# Patient Record
Sex: Female | Born: 1955
Health system: Southern US, Community
[De-identification: ages and names within clinical notes are randomized; demographics above are authoritative.]

## PROBLEM LIST (undated history)

## (undated) DIAGNOSIS — R51 Headache: Secondary | ICD-10-CM

## (undated) DIAGNOSIS — H269 Unspecified cataract: Secondary | ICD-10-CM

## (undated) DIAGNOSIS — E119 Type 2 diabetes mellitus without complications: Secondary | ICD-10-CM

## (undated) DIAGNOSIS — E785 Hyperlipidemia, unspecified: Secondary | ICD-10-CM

## (undated) HISTORY — DX: Unspecified cataract: H26.9

## (undated) HISTORY — DX: Hyperlipidemia, unspecified: E78.5

## (undated) HISTORY — PX: BREAST SURGERY: SHX581

## (undated) HISTORY — DX: Headache: R51

## (undated) HISTORY — DX: Morbid (severe) obesity due to excess calories: E66.01

---
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
## 1998-06-06 ENCOUNTER — Other Ambulatory Visit: Admission: RE | Admit: 1998-06-06 | Discharge: 1998-06-06 | Payer: Self-pay | Admitting: Obstetrics & Gynecology

## 1999-07-18 ENCOUNTER — Other Ambulatory Visit: Admission: RE | Admit: 1999-07-18 | Discharge: 1999-07-18 | Payer: Self-pay | Admitting: Obstetrics & Gynecology

## 2000-08-19 ENCOUNTER — Other Ambulatory Visit: Admission: RE | Admit: 2000-08-19 | Discharge: 2000-08-19 | Payer: Self-pay | Admitting: Obstetrics & Gynecology

## 2001-08-26 ENCOUNTER — Other Ambulatory Visit: Admission: RE | Admit: 2001-08-26 | Discharge: 2001-08-26 | Payer: Self-pay | Admitting: Obstetrics & Gynecology

## 2002-10-23 ENCOUNTER — Other Ambulatory Visit: Admission: RE | Admit: 2002-10-23 | Discharge: 2002-10-23 | Payer: Self-pay | Admitting: Obstetrics & Gynecology

## 2003-12-15 ENCOUNTER — Other Ambulatory Visit: Admission: RE | Admit: 2003-12-15 | Discharge: 2003-12-15 | Payer: Self-pay | Admitting: Obstetrics & Gynecology

## 2005-01-02 ENCOUNTER — Other Ambulatory Visit: Admission: RE | Admit: 2005-01-02 | Discharge: 2005-01-02 | Payer: Self-pay | Admitting: Obstetrics & Gynecology

## 2005-02-19 ENCOUNTER — Ambulatory Visit (HOSPITAL_COMMUNITY): Admission: RE | Admit: 2005-02-19 | Discharge: 2005-02-19 | Payer: Self-pay | Admitting: Gastroenterology

## 2010-09-01 ENCOUNTER — Other Ambulatory Visit: Payer: Self-pay | Admitting: Orthopedic Surgery

## 2010-09-01 DIAGNOSIS — Q762 Congenital spondylolisthesis: Secondary | ICD-10-CM

## 2010-09-06 ENCOUNTER — Ambulatory Visit
Admission: RE | Admit: 2010-09-06 | Discharge: 2010-09-06 | Disposition: A | Discharge status: Home / Self Care | Payer: 59 | Source: Ambulatory Visit | Attending: Orthopedic Surgery | Admitting: Orthopedic Surgery

## 2010-09-06 DIAGNOSIS — Q762 Congenital spondylolisthesis: Secondary | ICD-10-CM

## 2010-09-07 ENCOUNTER — Other Ambulatory Visit: Payer: Self-pay | Admitting: Orthopedic Surgery

## 2010-09-07 DIAGNOSIS — M545 Low back pain, unspecified: Secondary | ICD-10-CM

## 2010-09-08 ENCOUNTER — Ambulatory Visit
Admission: RE | Admit: 2010-09-08 | Discharge: 2010-09-08 | Disposition: A | Discharge status: Home / Self Care | Payer: 59 | Source: Ambulatory Visit | Attending: Orthopedic Surgery | Admitting: Orthopedic Surgery

## 2010-09-08 DIAGNOSIS — M545 Low back pain, unspecified: Secondary | ICD-10-CM

## 2010-11-27 ENCOUNTER — Other Ambulatory Visit: Payer: Self-pay | Admitting: Orthopedic Surgery

## 2010-11-27 DIAGNOSIS — M549 Dorsalgia, unspecified: Secondary | ICD-10-CM

## 2010-11-28 ENCOUNTER — Ambulatory Visit
Admission: RE | Admit: 2010-11-28 | Discharge: 2010-11-28 | Disposition: A | Discharge status: Home / Self Care | Payer: 59 | Source: Ambulatory Visit | Attending: Orthopedic Surgery | Admitting: Orthopedic Surgery

## 2010-11-28 VITALS — BP 140/90 | HR 70

## 2010-11-28 DIAGNOSIS — M549 Dorsalgia, unspecified: Secondary | ICD-10-CM

## 2010-11-28 MED ORDER — IOHEXOL 180 MG/ML  SOLN: 1.0000 mL | Freq: Once | INTRAMUSCULAR | Status: AC | PRN

## 2010-11-28 MED ORDER — METHYLPREDNISOLONE ACETATE 40 MG/ML INJ SUSP (RADIOLOG: 120.0000 mg | Freq: Once | INTRAMUSCULAR | Status: DC

## 2011-02-26 ENCOUNTER — Other Ambulatory Visit: Payer: Self-pay | Admitting: Orthopedic Surgery

## 2011-02-26 DIAGNOSIS — M545 Low back pain, unspecified: Secondary | ICD-10-CM

## 2011-03-01 ENCOUNTER — Ambulatory Visit
Admission: RE | Admit: 2011-03-01 | Discharge: 2011-03-01 | Disposition: A | Discharge status: Home / Self Care | Payer: 59 | Source: Ambulatory Visit | Attending: Orthopedic Surgery | Admitting: Orthopedic Surgery

## 2011-03-01 DIAGNOSIS — M545 Low back pain, unspecified: Secondary | ICD-10-CM

## 2011-03-01 MED ORDER — IOHEXOL 180 MG/ML  SOLN
1.0000 mL | Freq: Once | INTRAMUSCULAR | Status: AC | PRN
  Administered 2011-03-01: 1 mL via EPIDURAL

## 2011-03-01 MED ORDER — METHYLPREDNISOLONE ACETATE 40 MG/ML INJ SUSP (RADIOLOG
120.0000 mg | Freq: Once | INTRAMUSCULAR | Status: AC
  Administered 2011-03-01: 120 mg via EPIDURAL

## 2011-06-06 ENCOUNTER — Other Ambulatory Visit: Payer: Self-pay | Admitting: Orthopedic Surgery

## 2011-06-06 DIAGNOSIS — M4310 Spondylolisthesis, site unspecified: Secondary | ICD-10-CM

## 2011-06-12 ENCOUNTER — Ambulatory Visit
Admission: RE | Admit: 2011-06-12 | Discharge: 2011-06-12 | Disposition: A | Discharge status: Home / Self Care | Payer: 59 | Source: Ambulatory Visit | Attending: Orthopedic Surgery | Admitting: Orthopedic Surgery

## 2011-06-12 DIAGNOSIS — M4310 Spondylolisthesis, site unspecified: Secondary | ICD-10-CM

## 2011-06-12 MED ORDER — IOHEXOL 180 MG/ML  SOLN
1.0000 mL | Freq: Once | INTRAMUSCULAR | Status: AC | PRN
  Administered 2011-06-12: 1 mL via EPIDURAL

## 2011-06-12 MED ORDER — METHYLPREDNISOLONE ACETATE 40 MG/ML INJ SUSP (RADIOLOG
120.0000 mg | Freq: Once | INTRAMUSCULAR | Status: AC
  Administered 2011-06-12: 120 mg via EPIDURAL

## 2011-06-12 NOTE — Discharge Instructions (Signed)
 Post Procedure Spinal Discharge Instruction Sheet  1. You may resume a regular diet and any medications that you routinely take (including pain medications).  2. No driving day of procedure.  3. Light activity throughout the rest of the day.  Do not do any strenuous work, exercise, bending or lifting.  The day following the procedure, you can resume normal physical activity but you should refrain from exercising or physical therapy for at least three days thereafter.   Common Side Effects:   Headaches- take your usual medications as directed by your physician.  Increase your fluid intake.  Caffeinated beverages may be helpful.  Lie flat in bed until your headache resolves.   Restlessness or inability to sleep- you may have trouble sleeping for the next few days.  Ask your referring physician if you need any medication for sleep.   Facial flushing or redness- should subside within a few days.   Increased pain- a temporary increase in pain a day or two following your procedure is not unusual.  Take your pain medication as prescribed by your referring physician.   Leg cramps  Please contact our office at [phone] for the following symptoms:  Fever greater than 100 degrees.  Headaches unresolved with medication after 2-3 days.  Increased swelling, pain, or redness at injection site.  Thank you for visiting our office.

## 2011-08-07 ENCOUNTER — Other Ambulatory Visit: Payer: Self-pay | Admitting: Orthopedic Surgery

## 2011-08-07 DIAGNOSIS — M4310 Spondylolisthesis, site unspecified: Secondary | ICD-10-CM

## 2011-08-08 ENCOUNTER — Ambulatory Visit
Admission: RE | Admit: 2011-08-08 | Discharge: 2011-08-08 | Disposition: A | Discharge status: Home / Self Care | Payer: 59 | Source: Ambulatory Visit | Attending: Orthopedic Surgery | Admitting: Orthopedic Surgery

## 2011-08-08 VITALS — BP 146/79 | HR 80

## 2011-08-08 DIAGNOSIS — M4310 Spondylolisthesis, site unspecified: Secondary | ICD-10-CM

## 2011-08-08 MED ORDER — METHYLPREDNISOLONE ACETATE 40 MG/ML INJ SUSP (RADIOLOG
120.0000 mg | Freq: Once | INTRAMUSCULAR | Status: AC
  Administered 2011-08-08: 120 mg via EPIDURAL

## 2011-08-08 MED ORDER — IOHEXOL 180 MG/ML  SOLN
1.0000 mL | Freq: Once | INTRAMUSCULAR | Status: AC | PRN
  Administered 2011-08-08: 1 mL via EPIDURAL

## 2011-09-28 ENCOUNTER — Other Ambulatory Visit: Payer: Self-pay | Admitting: Orthopedic Surgery

## 2011-09-28 DIAGNOSIS — M4310 Spondylolisthesis, site unspecified: Secondary | ICD-10-CM

## 2011-10-01 ENCOUNTER — Ambulatory Visit
Admission: RE | Admit: 2011-10-01 | Discharge: 2011-10-01 | Disposition: A | Discharge status: Home / Self Care | Payer: BC Managed Care – PPO | Source: Ambulatory Visit | Attending: Orthopedic Surgery | Admitting: Orthopedic Surgery

## 2011-10-01 VITALS — BP 136/88 | HR 71 | Ht 64.0 in | Wt 240.0 lb

## 2011-10-01 DIAGNOSIS — M4310 Spondylolisthesis, site unspecified: Secondary | ICD-10-CM

## 2011-10-01 MED ORDER — IOHEXOL 180 MG/ML  SOLN
1.0000 mL | Freq: Once | INTRAMUSCULAR | Status: AC | PRN
  Administered 2011-10-01: 1 mL via EPIDURAL

## 2011-10-01 MED ORDER — METHYLPREDNISOLONE ACETATE 40 MG/ML INJ SUSP (RADIOLOG
120.0000 mg | Freq: Once | INTRAMUSCULAR | Status: AC
  Administered 2011-10-01: 120 mg via EPIDURAL

## 2011-10-18 ENCOUNTER — Ambulatory Visit (INDEPENDENT_AMBULATORY_CARE_PROVIDER_SITE_OTHER): Payer: BC Managed Care – PPO | Admitting: Family Medicine

## 2011-10-18 ENCOUNTER — Encounter: Payer: Self-pay | Admitting: Family Medicine

## 2011-10-18 VITALS — BP 138/82 | HR 77 | Temp 97.9°F | Resp 18 | Ht 63.5 in | Wt 248.0 lb

## 2011-10-18 DIAGNOSIS — Z01419 Encounter for gynecological examination (general) (routine) without abnormal findings: Secondary | ICD-10-CM

## 2011-10-18 DIAGNOSIS — Z0000 Encounter for general adult medical examination without abnormal findings: Secondary | ICD-10-CM

## 2011-10-18 DIAGNOSIS — Z130 Encounter for screening for diseases of the blood and blood-forming organs and certain disorders involving the immune mechanism: Secondary | ICD-10-CM

## 2011-10-18 DIAGNOSIS — Z124 Encounter for screening for malignant neoplasm of cervix: Secondary | ICD-10-CM

## 2011-10-18 DIAGNOSIS — R0789 Other chest pain: Secondary | ICD-10-CM

## 2011-10-18 DIAGNOSIS — B351 Tinea unguium: Secondary | ICD-10-CM

## 2011-10-18 DIAGNOSIS — R519 Headache, unspecified: Secondary | ICD-10-CM

## 2011-10-18 DIAGNOSIS — F4321 Adjustment disorder with depressed mood: Secondary | ICD-10-CM

## 2011-10-18 DIAGNOSIS — R51 Headache: Secondary | ICD-10-CM

## 2011-10-18 DIAGNOSIS — E785 Hyperlipidemia, unspecified: Secondary | ICD-10-CM

## 2011-10-18 LAB — COMPREHENSIVE METABOLIC PANEL WITH GFR
ALT: 18 U/L (ref 0–35)
AST: 16 U/L (ref 0–37)
Albumin: 4.1 g/dL (ref 3.5–5.2)
Alkaline Phosphatase: 67 U/L (ref 39–117)
BUN: 16 mg/dL (ref 6–23)
CO2: 22 meq/L (ref 19–32)
Calcium: 9.3 mg/dL (ref 8.4–10.5)
Chloride: 106 meq/L (ref 96–112)
Creat: 0.53 mg/dL (ref 0.50–1.10)
Glucose, Bld: 96 mg/dL (ref 70–99)
Potassium: 4 meq/L (ref 3.5–5.3)
Sodium: 141 meq/L (ref 135–145)
Total Bilirubin: 0.6 mg/dL (ref 0.3–1.2)
Total Protein: 6.7 g/dL (ref 6.0–8.3)

## 2011-10-18 LAB — POCT URINALYSIS DIPSTICK
Glucose, UA: NEGATIVE
Leukocytes, UA: NEGATIVE
Nitrite, UA: NEGATIVE
Protein, UA: NEGATIVE
Urobilinogen, UA: 0.2

## 2011-10-18 LAB — CBC WITH DIFFERENTIAL/PLATELET
Basophils Absolute: 0 10*3/uL (ref 0.0–0.1)
Eosinophils Relative: 1 % (ref 0–5)
HCT: 40.5 % (ref 36.0–46.0)
Lymphocytes Relative: 27 % (ref 12–46)
Lymphs Abs: 2.7 10*3/uL (ref 0.7–4.0)
MCV: 89.2 fL (ref 78.0–100.0)
Neutro Abs: 6.6 10*3/uL (ref 1.7–7.7)
Platelets: 250 10*3/uL (ref 150–400)
RBC: 4.54 MIL/uL (ref 3.87–5.11)
RDW: 14.5 % (ref 11.5–15.5)
WBC: 9.9 10*3/uL (ref 4.0–10.5)

## 2011-10-18 LAB — IFOBT (OCCULT BLOOD): IFOBT: NEGATIVE

## 2011-10-18 LAB — TSH: TSH: 1.321 u[IU]/mL (ref 0.350–4.500)

## 2011-10-18 LAB — LIPID PANEL
Cholesterol: 256 mg/dL — ABNORMAL HIGH (ref 0–200)
Total CHOL/HDL Ratio: 5.2 Ratio
Triglycerides: 153 mg/dL — ABNORMAL HIGH (ref <150)
VLDL: 31 mg/dL (ref 0–40)

## 2011-10-18 MED ORDER — NAFTIFINE HCL 1 % EX CREA: TOPICAL_CREAM | Freq: Every day | CUTANEOUS | Status: DC

## 2011-10-18 NOTE — Patient Instructions (Signed)
Keeping You Healthy  Get These Tests  Blood Pressure- Have your blood pressure checked by your healthcare provider at least once a year.  Normal blood pressure is 120/80.  Weight- Have your body mass index (BMI) calculated to screen for obesity.  BMI is a measure of body fat based on height and weight.  You can calculate your own BMI at https://www.west-esparza.com/  Cholesterol- Have your cholesterol checked every year.  Diabetes- Have your blood sugar checked every year if you have high blood pressure, high cholesterol, a family history of diabetes or if you are overweight.  Pap Smear- Have a pap smear every 1 to 3 years if you have been sexually active.  If you are older than 65 and recent pap smears have been normal you may not need additional pap smears.  In addition, if you have had a hysterectomy  For benign disease additional pap smears are not necessary.  Mammogram-Yearly mammograms are essential for early detection of breast cancer  Screening for Colon Cancer- Colonoscopy starting at age 36. Screening may begin sooner depending on your family history and other health conditions.  Follow up colonoscopy as directed by your Gastroenterologist.  Screening for Osteoporosis- Screening begins at age 66 with bone density scanning, sooner if you are at higher risk for developing Osteoporosis.  Get these medicines  Calcium with Vitamin D- Your body requires 1200-1500 mg of Calcium a day and [phone] IU of Vitamin D a day.  You can only absorb 500 mg of Calcium at a time therefore Calcium must be taken in 2 or 3 separate doses throughout the day.  Hormones- Hormone therapy has been associated with increased risk for certain cancers and heart disease.  Talk to your healthcare provider about if you need relief from menopausal symptoms.  Aspirin- Ask your healthcare provider about taking Aspirin to prevent Heart Disease and Stroke.  Get these Immuniztions  Flu shot- Every fall  Pneumonia  shot- Once after the age of 64; if you are younger ask your healthcare provider if you need a pneumonia shot.  Tetanus- Every ten years.  Zostavax- Once after the age of 78 to prevent shingles.  You have been given RX: Zostavax- take it to [facility] or [facility] at [facility] to receive vaccine  Take these steps  Don't smoke- Your healthcare provider can help you quit. For tips on how to quit, ask your healthcare provider or go to www.smokefree.gov or call 1-800 QUIT-NOW.  Be physically active- Exercise 5 days a week for a minimum of 30 minutes.  If you are not already physically active, start slow and gradually work up to 30 minutes of moderate physical activity.  Try walking, dancing, bike riding, swimming, etc.  Eat a healthy diet- Eat a variety of healthy foods such as fruits, vegetables, whole grains, low fat milk, low fat cheeses, yogurt, lean meats, chicken, fish, eggs, dried beans, tofu, etc.  For more information go to www.thenutritionsource.org  Dental visit- Brush and floss teeth twice daily; visit your dentist twice a year.  Eye exam- Visit your Optometrist or Ophthalmologist yearly.  Drink alcohol in moderation- Limit alcohol intake to one drink or less a day.  Never drink and drive.  Depression- Your emotional health is as important as your physical health.  If you're feeling down or losing interest in things you normally enjoy, please talk to your healthcare provider.  Seat Belts- can save your life; always wear one  Smoke/Carbon Monoxide detectors- These detectors need  to be installed on the appropriate level of your home.  Replace batteries at least once a year.  Violence- If anyone is threatening or hurting you, please tell your healthcare provider.  Living Will/ Health care power of attorney- Discuss with your healthcare provider and family.   GRIEF COUNSELING: Contact HOSPICE (they offer counseling to anyone who has experienced loss fo loved  ones)  Nail fungus- if your liver function tests are normal on the labs drawn today, I will let you know that I can prescribe an oral medication that you will need to take for about 3 months
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
## 2011-10-18 NOTE — Progress Notes (Deleted)
  Subjective:    Patient ID: Cynthia Fitzgerald, female    DOB: 03/26/1955, 56 y.o.   MRN: [id number]  HPI This 56 y.o.    Review of Systems     Objective:   Physical Exam        Assessment & Plan:

## 2011-10-22 ENCOUNTER — Encounter: Payer: Self-pay | Admitting: Family Medicine

## 2011-10-22 DIAGNOSIS — Z8049 Family history of malignant neoplasm of other genital organs: Secondary | ICD-10-CM | POA: Insufficient documentation

## 2011-10-22 DIAGNOSIS — Z803 Family history of malignant neoplasm of breast: Secondary | ICD-10-CM | POA: Insufficient documentation

## 2011-10-22 DIAGNOSIS — R0789 Other chest pain: Secondary | ICD-10-CM | POA: Insufficient documentation

## 2011-10-22 DIAGNOSIS — Z8042 Family history of malignant neoplasm of prostate: Secondary | ICD-10-CM | POA: Insufficient documentation

## 2011-10-22 DIAGNOSIS — R519 Headache, unspecified: Secondary | ICD-10-CM | POA: Insufficient documentation

## 2011-10-22 DIAGNOSIS — E785 Hyperlipidemia, unspecified: Secondary | ICD-10-CM | POA: Insufficient documentation

## 2011-10-22 DIAGNOSIS — Z820 Family history of epilepsy and other diseases of the nervous system: Secondary | ICD-10-CM | POA: Insufficient documentation

## 2011-10-23 ENCOUNTER — Telehealth: Payer: Self-pay

## 2011-10-23 NOTE — Telephone Encounter (Signed)
Pt states she called yesterday to discuss with dr Audria Nine about getting a referral to a cardiologist and would like to get this scheduled as soon as possible  Best number [phone]  Referrals does not have an order yet dfb

## 2011-10-23 NOTE — Telephone Encounter (Signed)
Cynthia Fitzgerald, it looks like this referral was put in yesterday, it shows up in referrals tab, what else needs to be done?

## 2011-10-24 NOTE — Progress Notes (Signed)
Quick Note:  Please call pt and advise that the following labs are abnormal... Lipid results are above normal; get OTC Omega 3 & 6 Fish Oil capsules 1000 mg and take 1 capsule twice a day. 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 We can recheck these numbers in 4-6 months. Blood counts (CBC) is normal. Thyroid test is normal. Chemistries are normal - let me know if you still want oral medication for your toenail fungus.  Vitamin D is low; get OTC Multivitamin for Women as well as Vitamin D3 2000 IU and take each of these daily. Short term daily sun exposure helps to increase Vit D level also. PAP is negative/ normal.  Copy to pt. ( Take any oil-containing capsules at a different time from any solid pills you may be taking.)  ______

## 2011-10-25 NOTE — Telephone Encounter (Signed)
Lela from [facility] would like for [place] to either fax over pts recent ekg or scan pts ekg into epic. Fax: [phone] CB: [phone] (Lela)

## 2011-10-25 NOTE — Telephone Encounter (Signed)
EKG scanned into Epic. Melissa at [facility] notified.

## 2011-11-13 ENCOUNTER — Other Ambulatory Visit: Payer: Self-pay | Admitting: Orthopedic Surgery

## 2011-11-13 DIAGNOSIS — M4310 Spondylolisthesis, site unspecified: Secondary | ICD-10-CM

## 2011-11-13 HISTORY — PX: EYE SURGERY: SHX253

## 2011-11-27 ENCOUNTER — Encounter: Payer: Self-pay | Admitting: Cardiology

## 2011-11-27 ENCOUNTER — Ambulatory Visit (INDEPENDENT_AMBULATORY_CARE_PROVIDER_SITE_OTHER): Payer: BC Managed Care – PPO | Admitting: Cardiology

## 2011-11-27 VITALS — BP 134/86 | HR 90 | Ht 63.0 in | Wt 250.8 lb

## 2011-11-27 DIAGNOSIS — R9431 Abnormal electrocardiogram [ECG] [EKG]: Secondary | ICD-10-CM

## 2011-11-27 DIAGNOSIS — E785 Hyperlipidemia, unspecified: Secondary | ICD-10-CM

## 2011-11-27 NOTE — Progress Notes (Signed)
HPI The patient presents for evaluation of an abnormal EKG. She was recently found to have an EKG with possible old anteroseptal infarct. She has had no prior cardiac workup or history. However, she's been under quite a bit of stress. Her close friend and coworker died at age 56 suddenly. This is apparently a myocardial infarction. He did have a family history. 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 She has been getting a sensation of excessive burping. However, she's not been getting any chest pressure, neck or arm discomfort. She's not been getting any palpitations, presyncope or syncope. She's not been having any new shortness of breath, PND or orthopnea. She's had no weight gain or edema. She walks at work though she does exercise routinely.  Allergies  Allergen Reactions  . Erythromycin Nausea Only    Current Outpatient Prescriptions  Medication Sig Dispense Refill  . Diphenhydramine-APAP, sleep, (TYLENOL PM EXTRA STRENGTH PO) Take by mouth as needed.      Marland Kitchen ibuprofen (ADVIL,MOTRIN) 200 MG tablet Take 200 mg by mouth every 6 (six) hours as needed.      . methylPREDNISolone acetate (DEPO-MEDROL) 40 MG/ML injection Inject 40 mg into the muscle every 6 (six) months. Take 3 injection every 6 month period, one injection every 2 month      . methylPREDNISolone acetate (DEPO-MEDROL) 80 MG/ML injection Inject 80 mg into the muscle every 6 (six) months. Take 80mg  injection every 2 months      . prednisoLONE acetate (PRED FORTE) 1 % ophthalmic suspension Place 1 drop into the right eye 4 (four) times daily.      . traMADol (ULTRAM-ER) 100 MG 24 hr tablet Take 100 mg by mouth daily as needed.        Past Medical History  Diagnosis Date  . Hyperlipidemia   . Other chest pain   . Headache   . Morbid obesity   . Family history of malignant neoplasm of breast   . Family history of malignant neoplasm of genital organ, other   . Family history of malignant neoplasm of  prostate   . Family history of other neurological diseases     Past Surgical History  Procedure Date  . Breast surgery   . Eye surgery 11/13/11  . Cesarean section     Family History  Problem Relation Age of Onset  . Hyperlipidemia Mother   . Hypertension Father   . Diabetes Father   . Hyperlipidemia Father   . Cancer Father     prostate  . Cancer Maternal Grandmother     uterine and cervical  . Diabetes Paternal Grandmother   . Cancer Paternal Grandfather     Esophageal and lung cancer    History   Social History  . Marital Status: Married    Spouse Name: N/A    Number of Children: N/A  . Years of Education: N/A   Occupational History  . Not on file.   Social History Main Topics  . Smoking status: Never Smoker   . Smokeless tobacco: Not on file  . Alcohol Use: Yes  . Drug Use: Not on file  . Sexually Active: Not on file   Other Topics Concern  . Not on file   Social History Narrative  . No narrative on file    ROS: Positive for headaches, neck pain. Otherwise as stated in the history of present illness and negative for all other systems.  PHYSICAL EXAM BP 134/86  Pulse 90  Ht  5\' 3"  (1.6 m)  Wt 250 lb 12.8 oz (113.762 kg)  BMI 44.43 kg/m2 GENERAL:  Well appearing HEENT:  Pupils equal round and reactive, fundi not visualized, oral mucosa unremarkable NECK:  No jugular venous distention, waveform within normal limits, carotid upstroke brisk and symmetric, no bruits, no thyromegaly LYMPHATICS:  No cervical, inguinal adenopathy LUNGS:  Clear to auscultation bilaterally BACK:  No CVA tenderness CHEST:  Unremarkable HEART:  PMI not displaced or sustained,S1 and S2 within normal limits, no S3, no S4, no clicks, no rubs, no murmurs ABD:  Flat, positive bowel sounds normal in frequency in pitch, no bruits, no rebound, no guarding, no midline pulsatile mass, no hepatomegaly, no splenomegaly EXT:  2 plus pulses throughout, no edema, no cyanosis no  clubbing SKIN:  No rashes no nodules NEURO:  Cranial nerves II through XII grossly intact, motor grossly intact throughout PSYCH:  Cognitively intact, oriented to person place and time   EKG:  Sinus rhythm, rate 82, axis within normal limits, intervals within normal limits, poor anterior R wave progression possible old anteroseptal infarct, no acute ST-T wave. 11/27/2011   ASSESSMENT AND PLAN  Abnormal EKG - I repeated the EKG today and it indeed does show possible old anteroseptal infarct though this is not consistent with her history.  I will bring her back for an exercise treadmill test because of her symptoms and an echocardiogram to rule wall motion abnormality. In addition I suggested coronary calcium score and she would like to proceed with this  Overweight - He spent a great deal of time discussing exercise and weight strategies. I think she will be compliant with lifestyle changes.  Hypertension- Her blood pressures well controlled she will continue the meds as listed.  Dyslipidemia - Her triglycerides and LDL were not at target.

## 2011-11-27 NOTE — Patient Instructions (Addendum)
The current medical regimen is effective;  continue present plan and medications.  Your physician has requested that you have an exercise tolerance test. For further information please visit [email or web address] Please also follow instruction sheet, as given.  Your physician has requested that you have an echocardiogram. Echocardiography is a painless test that uses sound waves to create images of your heart. It provides your doctor with information about the size and shape of your heart and how well your heart's chambers and valves are working. This procedure takes approximately one hour. There are no restrictions for this procedure.  Your physician has requested that you have calcium score. Cardiac computed tomography (CT) is a painless test that uses an x-ray machine to take clear, detailed pictures of your heart. For further information please visit [email or web address] Please follow instruction sheet as given.

## 2011-11-30 ENCOUNTER — Other Ambulatory Visit: Payer: Self-pay | Admitting: Orthopedic Surgery

## 2011-11-30 ENCOUNTER — Ambulatory Visit
Admission: RE | Admit: 2011-11-30 | Discharge: 2011-11-30 | Disposition: A | Discharge status: Home / Self Care | Payer: BC Managed Care – PPO | Source: Ambulatory Visit | Attending: Orthopedic Surgery | Admitting: Orthopedic Surgery

## 2011-11-30 DIAGNOSIS — M4310 Spondylolisthesis, site unspecified: Secondary | ICD-10-CM

## 2011-11-30 MED ORDER — IOHEXOL 180 MG/ML  SOLN
1.0000 mL | Freq: Once | INTRAMUSCULAR | Status: AC | PRN
  Administered 2011-11-30: 1 mL via EPIDURAL

## 2011-11-30 MED ORDER — METHYLPREDNISOLONE ACETATE 40 MG/ML INJ SUSP (RADIOLOG
120.0000 mg | Freq: Once | INTRAMUSCULAR | Status: AC
  Administered 2011-11-30: 120 mg via EPIDURAL

## 2011-12-04 NOTE — Addendum Note (Signed)
Addended by: Marrion Coy L on: 12/04/2011 11:30 AM   Modules accepted: Orders

## 2011-12-13 ENCOUNTER — Encounter: Payer: Self-pay | Admitting: Physician Assistant

## 2011-12-13 ENCOUNTER — Ambulatory Visit (HOSPITAL_COMMUNITY): Payer: BC Managed Care – PPO | Attending: Cardiology | Admitting: Radiology

## 2011-12-13 ENCOUNTER — Encounter: Payer: BC Managed Care – PPO | Admitting: Physician Assistant

## 2011-12-13 ENCOUNTER — Ambulatory Visit (INDEPENDENT_AMBULATORY_CARE_PROVIDER_SITE_OTHER): Payer: BC Managed Care – PPO | Admitting: Physician Assistant

## 2011-12-13 ENCOUNTER — Ambulatory Visit (INDEPENDENT_AMBULATORY_CARE_PROVIDER_SITE_OTHER)
Admission: RE | Admit: 2011-12-13 | Discharge: 2011-12-13 | Disposition: A | Discharge status: Home / Self Care | Payer: BC Managed Care – PPO | Source: Ambulatory Visit | Attending: Cardiology | Admitting: Cardiology

## 2011-12-13 DIAGNOSIS — R9431 Abnormal electrocardiogram [ECG] [EKG]: Secondary | ICD-10-CM

## 2011-12-13 NOTE — Procedures (Signed)
Exercise Treadmill Test  Pre-Exercise Testing Evaluation Rhythm: normal sinus  Rate: 71   PR:  .16 QRS:  .08  QT:  .35 QTc: .38     Test  Exercise Tolerance Test Ordering MD: Angelina Sheriff, MD  Interpreting MD: Tereso Newcomer , PA-C   Unique Test No: 1  Treadmill:  1  Indication for ETT: Abnormal EKG  Contraindication to ETT: No   Stress Modality: exercise - treadmill  Cardiac Imaging Performed: non   Protocol: standard Bruce - maximal  Max BP:  151/73  Max MPHR (bpm):  163 85% MPR (bpm):  139  MPHR obtained (bpm):  157 % MPHR obtained:  95%  Reached 85% MPHR (min:sec):  3:35 Total Exercise Time (min-sec):  6:00  Workload in METS:  7.0 Borg Scale: 16  Reason ETT Terminated:  patient's desire to stop    ST Segment Analysis At Rest: normal ST segments - no evidence of significant ST depression With Exercise: no evidence of significant ST depression  Other Information Arrhythmia:  No Angina during ETT:  absent (0) Quality of ETT:  diagnostic  ETT Interpretation:  normal - no evidence of ischemia by ST analysis  Comments: Fair exercise tolerance. No chest pain. Normal BP response to exercise. No ST-T changes to suggest ischemia.   Recommendations: Results d/w Blossom Hoops. Follow up with Dr. Rollene Rotunda as directed. Tereso Newcomer, PA-C  12:46 PM 12/13/2011

## 2011-12-13 NOTE — Progress Notes (Signed)
Echocardiogram performed.  

## 2012-01-31 ENCOUNTER — Other Ambulatory Visit: Payer: Self-pay | Admitting: Orthopedic Surgery

## 2012-01-31 DIAGNOSIS — M549 Dorsalgia, unspecified: Secondary | ICD-10-CM

## 2012-02-06 ENCOUNTER — Ambulatory Visit
Admission: RE | Admit: 2012-02-06 | Discharge: 2012-02-06 | Disposition: A | Discharge status: Home / Self Care | Payer: BC Managed Care – PPO | Source: Ambulatory Visit | Attending: Orthopedic Surgery | Admitting: Orthopedic Surgery

## 2012-02-06 ENCOUNTER — Other Ambulatory Visit: Payer: Self-pay | Admitting: Orthopedic Surgery

## 2012-02-06 DIAGNOSIS — M549 Dorsalgia, unspecified: Secondary | ICD-10-CM

## 2012-02-06 MED ORDER — METHYLPREDNISOLONE ACETATE 40 MG/ML INJ SUSP (RADIOLOG
120.0000 mg | Freq: Once | INTRAMUSCULAR | Status: AC
  Administered 2012-02-06: 120 mg via EPIDURAL

## 2012-02-06 MED ORDER — IOHEXOL 180 MG/ML  SOLN
1.0000 mL | Freq: Once | INTRAMUSCULAR | Status: AC | PRN
  Administered 2012-02-06: 1 mL via EPIDURAL

## 2012-04-15 ENCOUNTER — Other Ambulatory Visit: Payer: Self-pay | Admitting: Orthopedic Surgery

## 2012-04-15 DIAGNOSIS — M549 Dorsalgia, unspecified: Secondary | ICD-10-CM

## 2012-04-21 ENCOUNTER — Ambulatory Visit
Admission: RE | Admit: 2012-04-21 | Discharge: 2012-04-21 | Disposition: A | Discharge status: Home / Self Care | Payer: BC Managed Care – PPO | Source: Ambulatory Visit | Attending: Orthopedic Surgery | Admitting: Orthopedic Surgery

## 2012-04-21 VITALS — BP 157/84 | HR 74

## 2012-04-21 DIAGNOSIS — M549 Dorsalgia, unspecified: Secondary | ICD-10-CM

## 2012-04-21 MED ORDER — IOHEXOL 180 MG/ML  SOLN
1.0000 mL | Freq: Once | INTRAMUSCULAR | Status: AC | PRN
Start: 2012-04-21 — End: 2012-04-21
  Administered 2012-04-21: 1 mL via EPIDURAL

## 2012-04-21 MED ORDER — METHYLPREDNISOLONE ACETATE 40 MG/ML INJ SUSP (RADIOLOG
120.0000 mg | Freq: Once | INTRAMUSCULAR | Status: AC
  Administered 2012-04-21: 120 mg via EPIDURAL

## 2012-05-17 DIAGNOSIS — Z0271 Encounter for disability determination: Secondary | ICD-10-CM

## 2012-06-18 ENCOUNTER — Other Ambulatory Visit: Payer: Self-pay | Admitting: Orthopedic Surgery

## 2012-06-18 DIAGNOSIS — M4310 Spondylolisthesis, site unspecified: Secondary | ICD-10-CM

## 2012-07-02 ENCOUNTER — Ambulatory Visit
Admission: RE | Admit: 2012-07-02 | Discharge: 2012-07-02 | Disposition: A | Discharge status: Home / Self Care | Payer: BC Managed Care – PPO | Source: Ambulatory Visit | Attending: Orthopedic Surgery | Admitting: Orthopedic Surgery

## 2012-07-02 VITALS — BP 145/91 | HR 75

## 2012-07-02 DIAGNOSIS — M4310 Spondylolisthesis, site unspecified: Secondary | ICD-10-CM

## 2012-07-02 MED ORDER — IOHEXOL 180 MG/ML  SOLN
1.0000 mL | Freq: Once | INTRAMUSCULAR | Status: AC | PRN
  Administered 2012-07-02: 1 mL via EPIDURAL

## 2012-07-02 MED ORDER — METHYLPREDNISOLONE ACETATE 40 MG/ML INJ SUSP (RADIOLOG
120.0000 mg | Freq: Once | INTRAMUSCULAR | Status: AC
  Administered 2012-07-02: 120 mg via EPIDURAL

## 2012-07-07 ENCOUNTER — Other Ambulatory Visit: Payer: BC Managed Care – PPO

## 2012-07-18 ENCOUNTER — Telehealth: Payer: Self-pay

## 2012-07-18 NOTE — Telephone Encounter (Signed)
Hetty Blend is calling from [facility] in regards to conformation that the following tests were done: -TSH -Cult blood test -Vitamin D test -Pap -CBC urinalysis -EKG And also she would like to know if the patient was referred to Cardiology or not. She states that she has already paid for records but what was sent did include any tests so she would just like to make sure that they were done. Best# [phone] ext [phone]

## 2012-07-21 NOTE — Telephone Encounter (Signed)
This is for Maudia.

## 2012-07-31 ENCOUNTER — Other Ambulatory Visit: Payer: Self-pay | Admitting: Orthopedic Surgery

## 2012-07-31 DIAGNOSIS — M48061 Spinal stenosis, lumbar region without neurogenic claudication: Secondary | ICD-10-CM

## 2012-07-31 DIAGNOSIS — M545 Low back pain, unspecified: Secondary | ICD-10-CM

## 2012-08-03 ENCOUNTER — Ambulatory Visit
Admission: RE | Admit: 2012-08-03 | Discharge: 2012-08-03 | Disposition: A | Discharge status: Home / Self Care | Payer: BC Managed Care – PPO | Source: Ambulatory Visit | Attending: Orthopedic Surgery | Admitting: Orthopedic Surgery

## 2012-08-03 DIAGNOSIS — M545 Low back pain, unspecified: Secondary | ICD-10-CM

## 2012-08-03 DIAGNOSIS — M48061 Spinal stenosis, lumbar region without neurogenic claudication: Secondary | ICD-10-CM

## 2012-08-20 ENCOUNTER — Other Ambulatory Visit: Payer: Self-pay | Admitting: Orthopedic Surgery

## 2012-08-20 DIAGNOSIS — M549 Dorsalgia, unspecified: Secondary | ICD-10-CM

## 2012-08-25 ENCOUNTER — Ambulatory Visit
Admission: RE | Admit: 2012-08-25 | Discharge: 2012-08-25 | Disposition: A | Discharge status: Home / Self Care | Payer: BC Managed Care – PPO | Source: Ambulatory Visit | Attending: Orthopedic Surgery | Admitting: Orthopedic Surgery

## 2012-08-25 DIAGNOSIS — M549 Dorsalgia, unspecified: Secondary | ICD-10-CM

## 2012-08-25 MED ORDER — IOHEXOL 180 MG/ML  SOLN
1.0000 mL | Freq: Once | INTRAMUSCULAR | Status: AC | PRN
  Administered 2012-08-25: 1 mL via EPIDURAL

## 2012-08-25 MED ORDER — METHYLPREDNISOLONE ACETATE 40 MG/ML INJ SUSP (RADIOLOG
120.0000 mg | Freq: Once | INTRAMUSCULAR | Status: AC
  Administered 2012-08-25: 120 mg via EPIDURAL

## 2012-09-04 ENCOUNTER — Other Ambulatory Visit: Payer: Self-pay | Admitting: Neurosurgery

## 2012-10-02 ENCOUNTER — Encounter (HOSPITAL_COMMUNITY): Payer: Self-pay

## 2012-10-07 ENCOUNTER — Encounter (HOSPITAL_COMMUNITY)
Admission: RE | Admit: 2012-10-07 | Discharge: 2012-10-07 | Disposition: A | Discharge status: Home / Self Care | Payer: BC Managed Care – PPO | Source: Ambulatory Visit | Attending: Neurosurgery | Admitting: Neurosurgery

## 2012-10-07 ENCOUNTER — Encounter (HOSPITAL_COMMUNITY): Payer: Self-pay

## 2012-10-07 DIAGNOSIS — Z01812 Encounter for preprocedural laboratory examination: Secondary | ICD-10-CM | POA: Insufficient documentation

## 2012-10-07 DIAGNOSIS — Z01818 Encounter for other preprocedural examination: Secondary | ICD-10-CM | POA: Insufficient documentation

## 2012-10-07 LAB — BASIC METABOLIC PANEL
Calcium: 9.5 mg/dL (ref 8.4–10.5)
Creatinine, Ser: 0.58 mg/dL (ref 0.50–1.10)
GFR calc non Af Amer: >90 mL/min (ref >90)
Glucose, Bld: 95 mg/dL (ref 70–99)
Sodium: 136 mEq/L (ref 135–145)

## 2012-10-07 LAB — SURGICAL PCR SCREEN: MRSA, PCR: NEGATIVE

## 2012-10-07 LAB — CBC
Hemoglobin: 13.8 g/dL (ref 12.0–15.0)
MCH: 30.7 pg (ref 26.0–34.0)
MCHC: 34.2 g/dL (ref 30.0–36.0)

## 2012-10-07 LAB — TYPE AND SCREEN: Antibody Screen: NEGATIVE

## 2012-10-07 IMAGING — CT CT HEART SCORING
1 of 2 series · 8 of 20 positions shown, 10 images · non-contrast
Comparison: None
COMPARISON: None.

***ADDENDUM*** CREATED: 12/13/2011 [DATE]

OVER-READ INTERPRETATION - CT CHEST
The following report is an over-read performed by radiologist Dr.
[DATE].  This over-read does not include interpretation of
cardiac or coronary anatomy or pathology.  The coronary calcium
score interpretation by the cardiologist is attached.
INDICATION: High cholesterol.  Evaluate for calcium.
CT HEART FOR CALCIUM SCORING
TECHNIQUE: CT heart was performed on a 16 channel system using
prospective ECG gating.  A scout and noncontrast exam (for calcium
scoring) were performed.   Note that this exam targets the heart
and the chest was not imaged in its entirety.
INDICATION: Risk stratification
The patient was scanned on a Siemens Sensation 16 slice scanner.
Axial
nonconstrast images were performed through the heart.  The images
were
analyzed on a dedicated work-station and scored using the Agatson
method

[Series 3: calcium score · axial · 0.38mm/px · z∈[-214,-136]mm · 8 of 34 slices shown, 10 images]
[im 4/34  vessel]
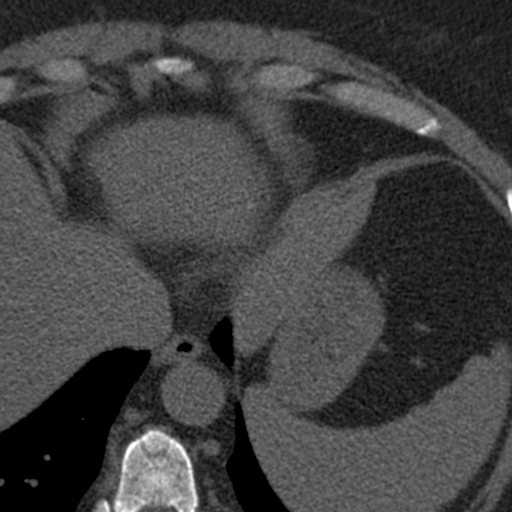
[im 4/34  lung]
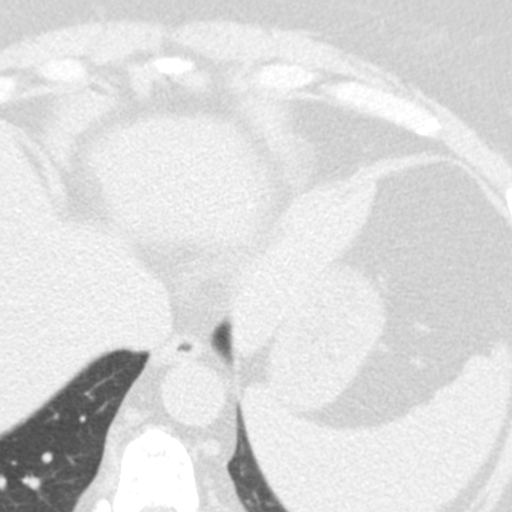
[im 8/34  vessel]
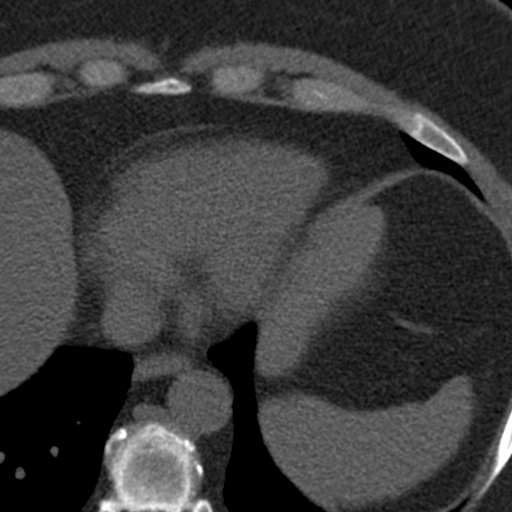
[im 12/34  vessel]
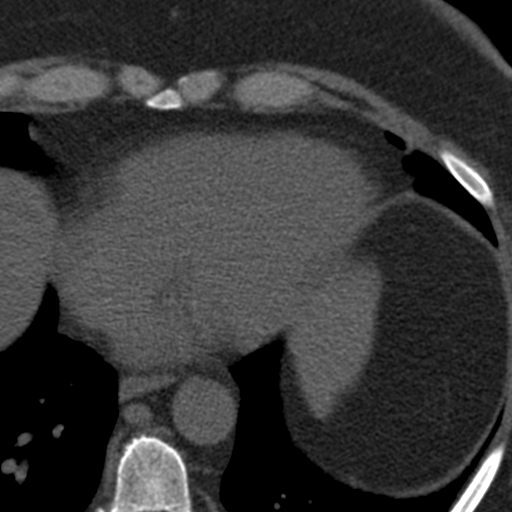
[im 15/34  vessel]
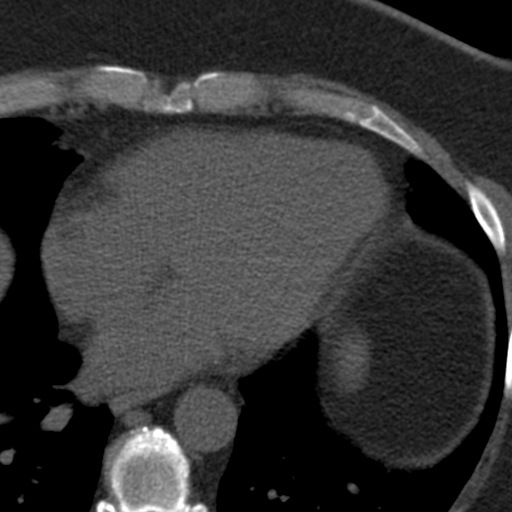
[im 19/34  vessel]
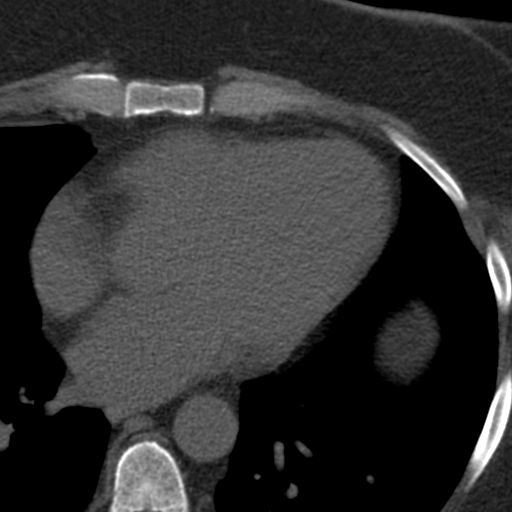
[im 19/34  lung]
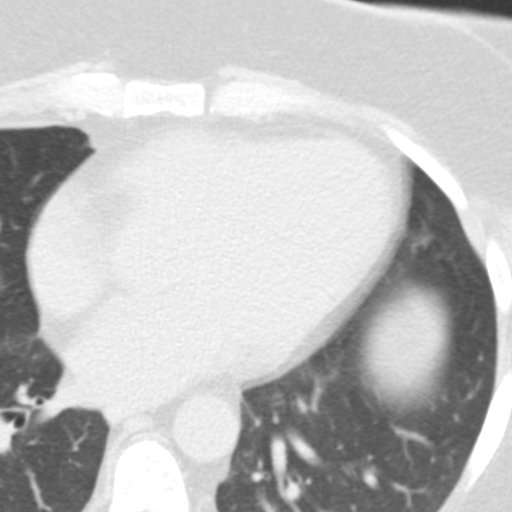
[im 23/34  vessel]
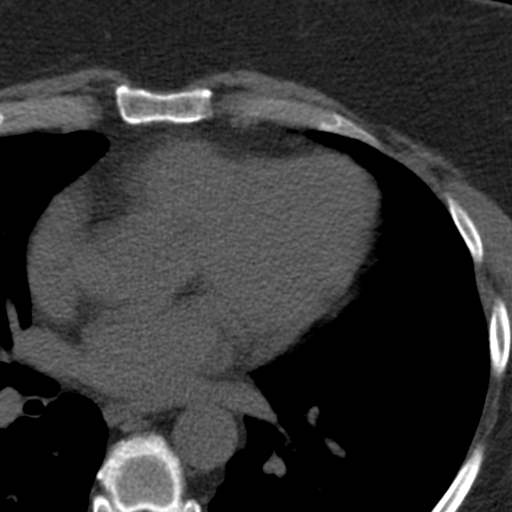
[im 26/34  vessel]
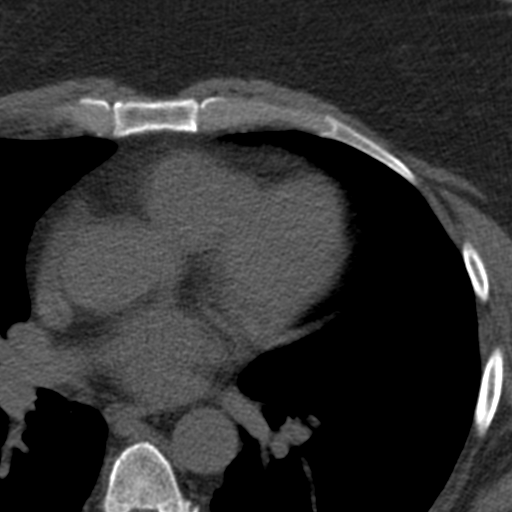
[im 30/34  vessel]
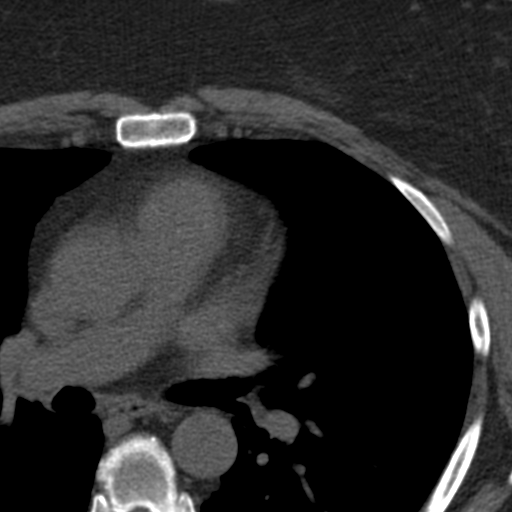

[8 of 20 positions shown; findings below may reference images not displayed]

FINDINGS: Limited view of the lung windows shows a single
calcified right
hilar lymph node.  Airways are normal.  Limited view of the
skeleton
and upper abdomen is unremarkable..
IMPRESSION: Single calcified lymph nodes suggest prior granulomatous disease.
No additional significant findings.

***END ADDENDUM*** SIGNED BY: Hon Piu Matutino, M.D.
FINDINGS: Technical quality:  Adequate

CORONARY CALCIUM:
Total Agatston Score:  Zero
[HOSPITAL] percentile:  Zero

EXTRACARDIAC FINDINGS:
Limited view of the lung windows shows a single calcified right
hilar lymph node.. Airways are normal  Limited view of the skeleton
and upper abdomen is unremarkable..
IMPRESSION: CALCIUM SCORE EQUAL ZERO.
Cardiac Calcium Score:
FINDINGS: No calcium seen in the coronary arteries

Normal pericardium

Normal ascending aorta at 3.2 cm
IMPRESSION: 1)    Calcium Score 0

2)    See separate over-read from [REDACTED].  No
significant noncardiac findings on limited soft tissue and lung
windows.

## 2012-10-07 NOTE — Progress Notes (Signed)
Primary physician - Dr. Lubertha Sayres ([facility]) Cardiologist - hochrein - when needed Sept 2013 ekg, echo, stress - in epic

## 2012-10-07 NOTE — Pre-Procedure Instructions (Signed)
ASPASIA RUDE  10/07/2012   Your procedure is scheduled on:  Thursday, July 31st  Report to [facility] at 0530 AM.  Call this number if you have problems the morning of surgery: [phone]   Remember:   Do not eat food or drink liquids after midnight.   Take these medicines the morning of surgery with A SIP OF WATER: none Stop taking ibuprofen 5 days prior to surgery   Do not wear jewelry, make-up or nail polish.  Do not wear lotions, powders, or perfumes, deodorant.  Do not shave 48 hours prior to surgery. Men may shave face and neck.  Do not bring valuables to the hospital.  [facility] is not responsible for any belongings or valuables.  Contacts, dentures or bridgework may not be worn into surgery.  Leave suitcase in the car. After surgery it may be brought to your room.  For patients admitted to the hospital, checkout time is 11:00 AM the day of discharge.   Patients discharged the day of surgery will not be allowed to drive home.   Special Instructions: Shower using CHG 2 nights before surgery and the night before surgery.  If you shower the day of surgery use CHG.  Use special wash - you have one bottle of CHG for all showers.  You should use approximately 1/3 of the bottle for each shower.   Please read over the following fact sheets that you were given: Pain Booklet, Coughing and Deep Breathing, Blood Transfusion Information, MRSA Information and Surgical Site Infection Prevention

## 2012-10-08 NOTE — Progress Notes (Signed)
Notified Jessica at [facility] of pt's positive MSSA, given pt.'s pharmacy: [facility] on [address].

## 2012-10-15 MED ORDER — CEFAZOLIN SODIUM-DEXTROSE 2-3 GM-% IV SOLR
2.0000 g | INTRAVENOUS | Status: AC
  Administered 2012-10-16: 2 g via INTRAVENOUS
  Filled 2012-10-15: qty 50

## 2012-10-15 NOTE — H&P (Signed)
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 She describes that this has been going on for the last three to four years, but has increased recently. She said she had a prednisone taper, which helped her for about two weeks only. She has had a total of 11 epidural steroid injections, one every two months and says that these are no longer giving her significant relief. She gets some relief with bending forward and has decreased pain when doing so. Standing increases her pain. She has been through physical therapy and multiple rounds of chiropractic treatment without any significant relief. Her last epidural injection was on 07/02/2012. PAST MEDICAL HISTORY:   Current Medical Conditions: She has elevated cholesterol.  Prior Operations: Significant for C-section on 04/09/1982 and a breast reduction in 03/2007. REVIEW OF SYSTEMS: Otherwise, her review of systems is unremarkable. SOCIAL HISTORY: She denies tobacco or drug use. She is a social drinker of alcoholic beverages.  PHYSICAL EXAMINATION: She is 5' 4-1/2" tall and weighs 245 pounds. Her BMI is 41.4. Blood pressure is 122/94. Heart rate is 76 and regular. Respiratory rate is 18.  Respiratory - Chest is clear to auscultation.  Cardiovascular - Regular rate and rhythm without murmur.  Abdomen - Soft, nontender, active bowel sounds; no hepatosplenomegaly appreciated.  Extremities - No edema, clubbing, or cyanosis in the lower extremities. Intact pedal pulses. NEUROLOGICAL EXAMINATION: The patient is awake, alert, and fully oriented. She speaks with clear and fluent speech. She has intact short- and long-term memory.   Cranial Nerve Examination - Her pupils are equal, round, and reactive to light. Extraocular movements are intact. Facial sensation and facial motor are intact and symmetric. Hearing is intact to  finger rub. Palate is upgoing. Shoulder shrug is symmetric. Tongue protrudes in the midline.  Motor Examination - Upper extremity strength is full in all motor groups, bilaterally and symmetric. Lower extremity strength is full in all motor groups, bilaterally and symmetric, with the exception of left dorsiflexion at 4/5, along with extensor hallucis longus strength of 4/5, and hip abductor strength of 4/5 on the left. She has a positive straight leg raise on the left. DIAGNOSTIC STUDIES: She has had a lumbar MRI, which demonstrates significant spondylolisthesis of L5 on S1 with spondylolysis at L5. Her plain radiographs obtained in the office today demonstrate that on neutral alignment, she has 20 mm of anterolisthesis of L5 on S1, which increases to 22.3 mm on flexion and decreases to 18.9 mm on extension. This represents a grade II to III spondylolisthesis. IMPRESSION AND RECOMMENDATIONS: Cynthia Fitzgerald is a 57 year old woman who has not been able to get sustained relief with prolonged conservative management, including a total of 11 epidural steroid injections. She has been through extensive physical therapy and medication management without great relief of her pain. She has a grade 2 to 3 spondylolisthesis of L5 on S1 with a spondylolysis at L5. I went over these studies and her examination in great detail with the patient. I do not believe that there is a nonsurgical option for her to pursue and have recommended that she undergo an L5 Gill procedure with an L5-S1 fusion procedure. I believe, despite her insurance of [facility], that there is really no alternative to surgery and that she would meet their restrictive criteria for surgical intervention, given her extensive nonsurgical management, her significant weakness, and her imaging findings. Risks and benefits were  discussed in detail with the patient. She wishes to proceed, and this has been scheduled for  10/16/2012. __________________________________ Danae Orleans. Venetia Maxon, MD JOB ID: [id number] DD: 09/13/2012 DT: 09/13/2012 REVIEWED BY: Electronically signed by Danae Orleans. Venetia Maxon on 09/16/2012 07:17 PM

## 2012-10-16 ENCOUNTER — Inpatient Hospital Stay (HOSPITAL_COMMUNITY): Payer: BC Managed Care – PPO

## 2012-10-16 ENCOUNTER — Inpatient Hospital Stay (HOSPITAL_COMMUNITY)
Admission: RE | Admit: 2012-10-16 | Discharge: 2012-10-19 | DRG: 756 | Disposition: A | Discharge status: Home Health | Payer: BC Managed Care – PPO | Source: Ambulatory Visit | Attending: Neurosurgery | Admitting: Neurosurgery

## 2012-10-16 ENCOUNTER — Encounter (HOSPITAL_COMMUNITY): Payer: Self-pay | Admitting: *Deleted

## 2012-10-16 ENCOUNTER — Inpatient Hospital Stay (HOSPITAL_COMMUNITY): Payer: BC Managed Care – PPO | Admitting: *Deleted

## 2012-10-16 ENCOUNTER — Encounter (HOSPITAL_COMMUNITY)
Admission: RE | Disposition: A | Discharge status: Home / Self Care | Payer: Self-pay | Source: Ambulatory Visit | Attending: Neurosurgery

## 2012-10-16 DIAGNOSIS — Z79899 Other long term (current) drug therapy: Secondary | ICD-10-CM

## 2012-10-16 DIAGNOSIS — M4316 Spondylolisthesis, lumbar region: Secondary | ICD-10-CM

## 2012-10-16 DIAGNOSIS — Q762 Congenital spondylolisthesis: Principal | ICD-10-CM

## 2012-10-16 DIAGNOSIS — E7800 Pure hypercholesterolemia, unspecified: Secondary | ICD-10-CM | POA: Diagnosis present

## 2012-10-16 DIAGNOSIS — I10 Essential (primary) hypertension: Secondary | ICD-10-CM | POA: Diagnosis present

## 2012-10-16 DIAGNOSIS — M48061 Spinal stenosis, lumbar region without neurogenic claudication: Secondary | ICD-10-CM | POA: Diagnosis present

## 2012-10-16 SURGERY — POSTERIOR LUMBAR FUSION 1 LEVEL
Anesthesia: General | Site: Back | Wound class: Clean

## 2012-10-16 MED ORDER — OXYCODONE HCL 5 MG/5ML PO SOLN: 5.0000 mg | Freq: Once | ORAL | Status: AC | PRN

## 2012-10-16 MED ORDER — BISACODYL 10 MG RE SUPP: 10.0000 mg | Freq: Every day | RECTAL | Status: DC | PRN

## 2012-10-16 MED ORDER — DIAZEPAM 5 MG PO TABS
5.0000 mg | ORAL_TABLET | Freq: Four times a day (QID) | ORAL | Status: DC | PRN
  Administered 2012-10-16 – 2012-10-17 (×3): 5 mg via ORAL
  Filled 2012-10-16 (×3): qty 1

## 2012-10-16 MED ORDER — MORPHINE SULFATE 2 MG/ML IJ SOLN
INTRAMUSCULAR | Status: AC
  Filled 2012-10-16: qty 1

## 2012-10-16 MED ORDER — DOCUSATE SODIUM 100 MG PO CAPS
100.0000 mg | ORAL_CAPSULE | Freq: Two times a day (BID) | ORAL | Status: DC
  Administered 2012-10-16 – 2012-10-19 (×6): 100 mg via ORAL
  Filled 2012-10-16 (×6): qty 1

## 2012-10-16 MED ORDER — NEOSTIGMINE METHYLSULFATE 1 MG/ML IJ SOLN
INTRAMUSCULAR | Status: DC | PRN
  Administered 2012-10-16: 4 mg via INTRAVENOUS

## 2012-10-16 MED ORDER — PHENYLEPHRINE HCL 10 MG/ML IJ SOLN
10.0000 mg | INTRAVENOUS | Status: DC | PRN
  Administered 2012-10-16: 10 ug/min via INTRAVENOUS

## 2012-10-16 MED ORDER — SODIUM CHLORIDE 0.9 % IJ SOLN: 3.0000 mL | INTRAMUSCULAR | Status: DC | PRN

## 2012-10-16 MED ORDER — PHENYLEPHRINE HCL 10 MG/ML IJ SOLN
INTRAMUSCULAR | Status: DC | PRN
  Administered 2012-10-16: 80 ug via INTRAVENOUS
  Administered 2012-10-16 (×2): 40 ug via INTRAVENOUS
  Administered 2012-10-16 (×2): 80 ug via INTRAVENOUS

## 2012-10-16 MED ORDER — POLYETHYLENE GLYCOL 3350 17 G PO PACK
17.0000 g | PACK | Freq: Every day | ORAL | Status: DC | PRN
  Filled 2012-10-16: qty 1

## 2012-10-16 MED ORDER — OXYCODONE-ACETAMINOPHEN 5-325 MG PO TABS
1.0000 | ORAL_TABLET | ORAL | Status: DC | PRN
  Administered 2012-10-16 – 2012-10-17 (×4): 2 via ORAL
  Filled 2012-10-16 (×4): qty 2

## 2012-10-16 MED ORDER — LIDOCAINE-EPINEPHRINE 1 %-1:100000 IJ SOLN
INTRAMUSCULAR | Status: DC | PRN
  Administered 2012-10-16: 5 mL

## 2012-10-16 MED ORDER — SUFENTANIL CITRATE 50 MCG/ML IV SOLN
INTRAVENOUS | Status: DC | PRN
  Administered 2012-10-16 (×5): 10 ug via INTRAVENOUS

## 2012-10-16 MED ORDER — ONDANSETRON HCL 4 MG/2ML IJ SOLN
4.0000 mg | INTRAMUSCULAR | Status: DC | PRN
  Administered 2012-10-18: 4 mg via INTRAVENOUS
  Filled 2012-10-16 (×2): qty 2

## 2012-10-16 MED ORDER — SODIUM CHLORIDE 0.9 % IV SOLN: 250.0000 mL | INTRAVENOUS | Status: DC

## 2012-10-16 MED ORDER — MIDAZOLAM HCL 5 MG/5ML IJ SOLN
INTRAMUSCULAR | Status: DC | PRN
  Administered 2012-10-16: 2 mg via INTRAVENOUS

## 2012-10-16 MED ORDER — DIAZEPAM 5 MG PO TABS
ORAL_TABLET | ORAL | Status: AC
  Filled 2012-10-16: qty 1

## 2012-10-16 MED ORDER — OXYCODONE HCL 5 MG PO TABS
5.0000 mg | ORAL_TABLET | Freq: Once | ORAL | Status: AC | PRN
  Administered 2012-10-16: 5 mg via ORAL

## 2012-10-16 MED ORDER — METHYLPREDNISOLONE ACETATE 40 MG/ML IJ SUSP
40.0000 mg | INTRAMUSCULAR | Status: DC
  Filled 2012-10-16: qty 1

## 2012-10-16 MED ORDER — MENTHOL 3 MG MT LOZG: 1.0000 | LOZENGE | OROMUCOSAL | Status: DC | PRN

## 2012-10-16 MED ORDER — OXYCODONE HCL 5 MG PO TABS
ORAL_TABLET | ORAL | Status: AC
  Filled 2012-10-16: qty 1

## 2012-10-16 MED ORDER — HYDROMORPHONE HCL PF 1 MG/ML IJ SOLN
INTRAMUSCULAR | Status: AC
  Administered 2012-10-16: 0.5 mg via INTRAVENOUS
  Filled 2012-10-16: qty 1

## 2012-10-16 MED ORDER — HYDROMORPHONE HCL PF 1 MG/ML IJ SOLN
INTRAMUSCULAR | Status: DC | PRN
  Administered 2012-10-16: 0.5 mg via INTRAVENOUS

## 2012-10-16 MED ORDER — HYDROCODONE-ACETAMINOPHEN 5-325 MG PO TABS
1.0000 | ORAL_TABLET | ORAL | Status: DC | PRN
  Administered 2012-10-17 (×2): 2 via ORAL
  Administered 2012-10-18: 1 via ORAL
  Administered 2012-10-18: 2 via ORAL
  Administered 2012-10-18 (×2): 1 via ORAL
  Administered 2012-10-18: 2 via ORAL
  Administered 2012-10-18 – 2012-10-19 (×4): 1 via ORAL
  Filled 2012-10-16: qty 1
  Filled 2012-10-16 (×2): qty 2
  Filled 2012-10-16 (×4): qty 1
  Filled 2012-10-16 (×3): qty 2
  Filled 2012-10-16: qty 1

## 2012-10-16 MED ORDER — FLEET ENEMA 7-19 GM/118ML RE ENEM: 1.0000 | ENEMA | Freq: Once | RECTAL | Status: AC | PRN

## 2012-10-16 MED ORDER — LIDOCAINE HCL (CARDIAC) 20 MG/ML IV SOLN
INTRAVENOUS | Status: DC | PRN
  Administered 2012-10-16: 100 mg via INTRAVENOUS

## 2012-10-16 MED ORDER — HYDROMORPHONE HCL PF 1 MG/ML IJ SOLN
INTRAMUSCULAR | Status: AC
  Filled 2012-10-16: qty 1

## 2012-10-16 MED ORDER — ROCURONIUM BROMIDE 100 MG/10ML IV SOLN
INTRAVENOUS | Status: DC | PRN
  Administered 2012-10-16: 50 mg via INTRAVENOUS

## 2012-10-16 MED ORDER — PROPOFOL 10 MG/ML IV BOLUS
INTRAVENOUS | Status: DC | PRN
  Administered 2012-10-16: 120 mg via INTRAVENOUS

## 2012-10-16 MED ORDER — ACETAMINOPHEN 325 MG PO TABS: 650.0000 mg | ORAL_TABLET | ORAL | Status: DC | PRN

## 2012-10-16 MED ORDER — HYDROMORPHONE HCL PF 1 MG/ML IJ SOLN
0.2500 mg | INTRAMUSCULAR | Status: DC | PRN
  Administered 2012-10-16 (×3): 0.5 mg via INTRAVENOUS

## 2012-10-16 MED ORDER — MEPERIDINE HCL 25 MG/ML IJ SOLN: 6.2500 mg | INTRAMUSCULAR | Status: DC | PRN

## 2012-10-16 MED ORDER — MORPHINE SULFATE 2 MG/ML IJ SOLN
1.0000 mg | INTRAMUSCULAR | Status: DC | PRN
  Administered 2012-10-16: 4 mg via INTRAVENOUS
  Administered 2012-10-16 – 2012-10-17 (×3): 2 mg via INTRAVENOUS
  Filled 2012-10-16 (×2): qty 2
  Filled 2012-10-16: qty 1

## 2012-10-16 MED ORDER — PHENOL 1.4 % MT LIQD: 1.0000 | OROMUCOSAL | Status: DC | PRN

## 2012-10-16 MED ORDER — KCL IN DEXTROSE-NACL 20-5-0.45 MEQ/L-%-% IV SOLN
INTRAVENOUS | Status: DC
  Administered 2012-10-16: 19:00:00 via INTRAVENOUS
  Filled 2012-10-16 (×6): qty 1000

## 2012-10-16 MED ORDER — ALUM & MAG HYDROXIDE-SIMETH 200-200-20 MG/5ML PO SUSP: 30.0000 mL | Freq: Four times a day (QID) | ORAL | Status: DC | PRN

## 2012-10-16 MED ORDER — PANTOPRAZOLE SODIUM 40 MG IV SOLR
40.0000 mg | Freq: Every day | INTRAVENOUS | Status: DC
  Administered 2012-10-16 – 2012-10-17 (×2): 40 mg via INTRAVENOUS
  Filled 2012-10-16 (×3): qty 40

## 2012-10-16 MED ORDER — VECURONIUM BROMIDE 10 MG IV SOLR
INTRAVENOUS | Status: DC | PRN
  Administered 2012-10-16: 1 mg via INTRAVENOUS
  Administered 2012-10-16: 2 mg via INTRAVENOUS

## 2012-10-16 MED ORDER — CEFAZOLIN SODIUM 1-5 GM-% IV SOLN
1.0000 g | Freq: Three times a day (TID) | INTRAVENOUS | Status: AC
  Administered 2012-10-16 (×2): 1 g via INTRAVENOUS
  Filled 2012-10-16 (×2): qty 50

## 2012-10-16 MED ORDER — SENNA 8.6 MG PO TABS
1.0000 | ORAL_TABLET | Freq: Two times a day (BID) | ORAL | Status: DC
  Administered 2012-10-17 – 2012-10-19 (×5): 8.6 mg via ORAL
  Filled 2012-10-16 (×7): qty 1

## 2012-10-16 MED ORDER — LACTATED RINGERS IV SOLN
INTRAVENOUS | Status: DC | PRN
  Administered 2012-10-16 (×3): via INTRAVENOUS

## 2012-10-16 MED ORDER — ONDANSETRON HCL 4 MG/2ML IJ SOLN
INTRAMUSCULAR | Status: AC
  Administered 2012-10-16: 4 mg via INTRAVENOUS
  Filled 2012-10-16: qty 2

## 2012-10-16 MED ORDER — METHYLPREDNISOLONE ACETATE 80 MG/ML IJ SUSP
80.0000 mg | INTRAMUSCULAR | Status: DC
  Filled 2012-10-16: qty 1

## 2012-10-16 MED ORDER — BUPIVACAINE HCL (PF) 0.5 % IJ SOLN
INTRAMUSCULAR | Status: DC | PRN
  Administered 2012-10-16: 10 mL

## 2012-10-16 MED ORDER — 0.9 % SODIUM CHLORIDE (POUR BTL) OPTIME
TOPICAL | Status: DC | PRN
  Administered 2012-10-16: 1000 mL

## 2012-10-16 MED ORDER — THROMBIN 20000 UNITS EX SOLR
CUTANEOUS | Status: DC | PRN
  Administered 2012-10-16: 09:00:00 via TOPICAL

## 2012-10-16 MED ORDER — ZOLPIDEM TARTRATE 5 MG PO TABS: 5.0000 mg | ORAL_TABLET | Freq: Every evening | ORAL | Status: DC | PRN

## 2012-10-16 MED ORDER — ONDANSETRON HCL 4 MG/2ML IJ SOLN
INTRAMUSCULAR | Status: DC | PRN
  Administered 2012-10-16: 4 mg via INTRAVENOUS

## 2012-10-16 MED ORDER — GLYCOPYRROLATE 0.2 MG/ML IJ SOLN
INTRAMUSCULAR | Status: DC | PRN
  Administered 2012-10-16: .6 mg via INTRAVENOUS

## 2012-10-16 MED ORDER — ONDANSETRON HCL 4 MG/2ML IJ SOLN: 4.0000 mg | Freq: Once | INTRAMUSCULAR | Status: AC | PRN

## 2012-10-16 MED ORDER — ACETAMINOPHEN 650 MG RE SUPP: 650.0000 mg | RECTAL | Status: DC | PRN

## 2012-10-16 MED ORDER — SODIUM CHLORIDE 0.9 % IJ SOLN
3.0000 mL | Freq: Two times a day (BID) | INTRAMUSCULAR | Status: DC
  Administered 2012-10-17: 3 mL via INTRAVENOUS

## 2012-10-16 SURGICAL SUPPLY — 90 items
ADH SKN CLS APL DERMABOND .7 (GAUZE/BANDAGES/DRESSINGS) ×1
ADH SKN CLS LQ APL DERMABOND (GAUZE/BANDAGES/DRESSINGS) ×1
APL SKNCLS STERI-STRIP NONHPOA (GAUZE/BANDAGES/DRESSINGS) ×1
BAG DECANTER FOR FLEXI CONT (MISCELLANEOUS) ×2 IMPLANT
BENZOIN TINCTURE PRP APPL 2/3 (GAUZE/BANDAGES/DRESSINGS) ×2 IMPLANT
BLADE SURG ROTATE 9660 (MISCELLANEOUS) IMPLANT
BONE VOID FILLER STRIP 10CC (Bone Implant) ×1 IMPLANT
BUR MATCHSTICK NEURO 3.0 LAGG (BURR) ×2 IMPLANT
BUR PRECISION FLUTE 5.0 (BURR) ×2 IMPLANT
CAGE 9MM (Cage) ×2 IMPLANT
CANISTER SUCTION 2500CC (MISCELLANEOUS) ×2 IMPLANT
CLOTH BEACON ORANGE TIMEOUT ST (SAFETY) ×2 IMPLANT
CONT SPEC 4OZ CLIKSEAL STRL BL (MISCELLANEOUS) ×4 IMPLANT
COVER BACK TABLE 24X17X13 BIG (DRAPES) IMPLANT
COVER TABLE BACK 60X90 (DRAPES) ×2 IMPLANT
DERMABOND ADHESIVE PROPEN (GAUZE/BANDAGES/DRESSINGS) ×1
DERMABOND ADVANCED (GAUZE/BANDAGES/DRESSINGS) ×1
DERMABOND ADVANCED .7 DNX12 (GAUZE/BANDAGES/DRESSINGS) ×1 IMPLANT
DERMABOND ADVANCED .7 DNX6 (GAUZE/BANDAGES/DRESSINGS) IMPLANT
DRAPE C-ARM 42X72 X-RAY (DRAPES) ×4 IMPLANT
DRAPE LAPAROTOMY 100X72X124 (DRAPES) ×2 IMPLANT
DRAPE POUCH INSTRU U-SHP 10X18 (DRAPES) ×2 IMPLANT
DRAPE PROXIMA HALF (DRAPES) ×3 IMPLANT
DRAPE SURG 17X23 STRL (DRAPES) ×2 IMPLANT
DRESSING TELFA 8X3 (GAUZE/BANDAGES/DRESSINGS) ×2 IMPLANT
DRSG OPSITE POSTOP 4X6 (GAUZE/BANDAGES/DRESSINGS) ×1 IMPLANT
DURAPREP 26ML APPLICATOR (WOUND CARE) ×2 IMPLANT
ELECT BLADE 4.0 EZ CLEAN MEGAD (MISCELLANEOUS) ×2
ELECT REM PT RETURN 9FT ADLT (ELECTROSURGICAL) ×2
ELECTRODE BLDE 4.0 EZ CLN MEGD (MISCELLANEOUS) IMPLANT
ELECTRODE REM PT RTRN 9FT ADLT (ELECTROSURGICAL) ×1 IMPLANT
EVACUATOR 1/8 PVC DRAIN (DRAIN) ×1 IMPLANT
GAUZE SPONGE 4X4 16PLY XRAY LF (GAUZE/BANDAGES/DRESSINGS) ×1 IMPLANT
GLOVE BIO SURGEON STRL SZ8 (GLOVE) ×4 IMPLANT
GLOVE BIOGEL PI IND STRL 7.0 (GLOVE) IMPLANT
GLOVE BIOGEL PI IND STRL 7.5 (GLOVE) IMPLANT
GLOVE BIOGEL PI IND STRL 8 (GLOVE) ×2 IMPLANT
GLOVE BIOGEL PI IND STRL 8.5 (GLOVE) ×2 IMPLANT
GLOVE BIOGEL PI INDICATOR 7.0 (GLOVE) ×1
GLOVE BIOGEL PI INDICATOR 7.5 (GLOVE) ×1
GLOVE BIOGEL PI INDICATOR 8 (GLOVE) ×2
GLOVE BIOGEL PI INDICATOR 8.5 (GLOVE) ×3
GLOVE ECLIPSE 6.5 STRL STRAW (GLOVE) ×1 IMPLANT
GLOVE ECLIPSE 8.0 STRL XLNG CF (GLOVE) ×5 IMPLANT
GLOVE EXAM NITRILE LRG STRL (GLOVE) IMPLANT
GLOVE EXAM NITRILE MD LF STRL (GLOVE) IMPLANT
GLOVE EXAM NITRILE XL STR (GLOVE) IMPLANT
GLOVE EXAM NITRILE XS STR PU (GLOVE) IMPLANT
GLOVE SURG SS PI 7.0 STRL IVOR (GLOVE) ×3 IMPLANT
GOWN BRE IMP SLV AUR LG STRL (GOWN DISPOSABLE) IMPLANT
GOWN BRE IMP SLV AUR XL STRL (GOWN DISPOSABLE) ×2 IMPLANT
GOWN STRL REIN 2XL LVL4 (GOWN DISPOSABLE) ×2 IMPLANT
KIT BASIN OR (CUSTOM PROCEDURE TRAY) ×2 IMPLANT
KIT INFUSE SMALL (Orthopedic Implant) ×1 IMPLANT
KIT POSITION SURG JACKSON T1 (MISCELLANEOUS) ×2 IMPLANT
KIT ROOM TURNOVER OR (KITS) ×2 IMPLANT
MILL MEDIUM DISP (BLADE) ×3 IMPLANT
NDL HYPO 25X1 1.5 SAFETY (NEEDLE) ×1 IMPLANT
NDL SPNL 18GX3.5 QUINCKE PK (NEEDLE) IMPLANT
NEEDLE HYPO 25X1 1.5 SAFETY (NEEDLE) ×2 IMPLANT
NEEDLE SPNL 18GX3.5 QUINCKE PK (NEEDLE) IMPLANT
NS IRRIG 1000ML POUR BTL (IV SOLUTION) ×2 IMPLANT
PACK LAMINECTOMY NEURO (CUSTOM PROCEDURE TRAY) ×2 IMPLANT
PAD ARMBOARD 7.5X6 YLW CONV (MISCELLANEOUS) ×6 IMPLANT
PATTIES SURGICAL .5 X.5 (GAUZE/BANDAGES/DRESSINGS) IMPLANT
PATTIES SURGICAL .5 X1 (DISPOSABLE) IMPLANT
PATTIES SURGICAL 1X1 (DISPOSABLE) IMPLANT
ROD 35MM (Rod) ×1 IMPLANT
SCREW 40MM (Screw) ×1 IMPLANT
SCREW PEDICLE 45MM (Screw) ×1 IMPLANT
SCREW POLYAXIAL 5.5X50MM (Screw) ×2 IMPLANT
SCREW SET SPINAL STD HEXALOBE (Screw) ×4 IMPLANT
SPONGE GAUZE 4X4 12PLY (GAUZE/BANDAGES/DRESSINGS) ×2 IMPLANT
SPONGE LAP 4X18 X RAY DECT (DISPOSABLE) IMPLANT
SPONGE SURGIFOAM ABS GEL 100 (HEMOSTASIS) ×2 IMPLANT
STAPLER SKIN PROX WIDE 3.9 (STAPLE) IMPLANT
STRIP CLOSURE SKIN 1/2X4 (GAUZE/BANDAGES/DRESSINGS) ×2 IMPLANT
SUT VIC AB 1 CT1 18XBRD ANBCTR (SUTURE) ×2 IMPLANT
SUT VIC AB 1 CT1 8-18 (SUTURE) ×4
SUT VIC AB 2-0 CT1 18 (SUTURE) ×3 IMPLANT
SUT VIC AB 3-0 SH 8-18 (SUTURE) ×4 IMPLANT
SYR 20ML ECCENTRIC (SYRINGE) ×2 IMPLANT
SYR 3ML LL SCALE MARK (SYRINGE) ×4 IMPLANT
SYR 5ML LL (SYRINGE) IMPLANT
TAPE CLOTH SURG 4X10 WHT LF (GAUZE/BANDAGES/DRESSINGS) ×1 IMPLANT
TOWEL OR 17X24 6PK STRL BLUE (TOWEL DISPOSABLE) ×2 IMPLANT
TOWEL OR 17X26 10 PK STRL BLUE (TOWEL DISPOSABLE) ×2 IMPLANT
TRAP SPECIMEN MUCOUS 40CC (MISCELLANEOUS) ×2 IMPLANT
TRAY FOLEY CATH 14FRSI W/METER (CATHETERS) ×2 IMPLANT
WATER STERILE IRR 1000ML POUR (IV SOLUTION) ×2 IMPLANT

## 2012-10-16 NOTE — Transfer of Care (Signed)
Immediate Anesthesia Transfer of Care Note  Patient: Cynthia Fitzgerald  Procedure(s) Performed: Procedure(s) with comments: Lumbar Five Gill procedure with Lumbar Five to Sacral One Posterior Lumbar Fusion (N/A) - POSTERIOR LUMBAR FUSION 1 LEVEL  Patient Location: PACU  Anesthesia Type:General  Level of Consciousness: sedated  Airway & Oxygen Therapy: Patient Spontanous Breathing and Patient connected to face mask oxygen  Post-op Assessment: Report given to PACU RN and Post -op Vital signs reviewed and stable  Post vital signs: Reviewed and stable  Complications: No apparent anesthesia complications

## 2012-10-16 NOTE — Brief Op Note (Signed)
10/16/2012  10:45 AM  PATIENT:  Cynthia Fitzgerald  57 y.o. female  PRE-OPERATIVE DIAGNOSIS:  Congenital spondylolisthesis, Congenital lumbar spondylolysis, Lumbar stenosis, Lumbar radiculopathy L5S1  POST-OPERATIVE DIAGNOSIS:  Congenital spondylolisthesis, Congenital lumbar spondylolysis, Lumbar stenosis, Lumbar radiculopathy L5S1   PROCEDURE:  Procedure(s) with comments: Lumbar Five Gill procedure with Lumbar Five to Sacral One Posterior Lumbar Fusion (N/A) - POSTERIOR LUMBAR FUSION 1 LEVEL PLIF, pedicle screw fixation and posterolateral arthrodesis  SURGEON:  Surgeon(s) and Role:    * Maeola Harman, MD - Primary    * Carmela Hurt, MD - Assisting  PHYSICIAN ASSISTANT:   ASSISTANTS: Poteat, RN   ANESTHESIA:   general  EBL:  Total I/O In: 2500 [I.V.:2500] Out: 500 [Urine:350; Blood:150]  BLOOD ADMINISTERED:none  DRAINS: (Medium) Hemovact drain(s) in the epidural space with  Suction Open   LOCAL MEDICATIONS USED:  LIDOCAINE   SPECIMEN:  No Specimen  DISPOSITION OF SPECIMEN:  N/A  COUNTS:  YES  TOURNIQUET:  * No tourniquets in log *  DICTATION: Patient is 57 year old woman with mobile spondylolisthesis of L5 on LS1 with lumbar stenosis and L5 spodylolysis. She has a severe bilateral L5 radiculopathies.  She is morbidly obese. It was elected to take her to surgery for decompression and fusion at this level.   Procedure: Patient was placed in a prone position on the [place] table after smooth and uncomplicated induction of general endotracheal anesthesia. Her low back was prepped and draped in usual sterile fashion with betadine scrub and DuraPrep. Area of incision was infiltrated with local lidocaine. Incision was made to the lumbodorsal fascia was incised and exposure was performed of the L5 through S1 spinous processes laminae facet joint and transverse processes. Intraoperative x-ray was obtained which confirmed correct orientation. A Gill procedure of L5 was performed with  disarticulation of the facet joints at this level and thorough decompression was performed of both L5 and S1 nerve roots along with the common dural tube. This decompression was more involved than would be typical of that performed for PLIF alone and included painstaking dissection of adherent ligament compressing the thecal sac and wide decompression of all neural elements. A thorough discectomy was initially performed on the left with preparation of the endplates for grafting a trial spacer was placed this level and a thorough discectomy was performed on the right as well. Bone autograft was packed within the interspace bilaterally along with small BMP kit and NexOss bone graft extender. Bilateral medium 9 mm peek cages were packed with BMP and extender and was inserted the interspace and countersunk appropriately along with 6 cc of morselized bone autograft. The posterolateral region was extensively decorticated and pedicle probes were placed at L5 and S1 bilaterally. Intraoperative fluoroscopy confirmed correct orientationin the AP and lateral plane. 40 x 6.5 mm pedicle screws were placed at S1 bilaterally and 50 x 5.5 mm screws placed at L5 bilaterally final x-rays demonstrated well-positioned interbody grafts and pedicle screw fixation.  35 mm lordotic rods were placed on the bilaterally and locked down in situ and the posterolateral region was packed with the remaining autograft and bone graft extender bilaterally. The wound was irrigated and a medium Hemovac drain was placed in the epidural space. Fascia was closed with 1 Vicryl sutures skin edges were reapproximated 2 and 3-0 Vicryl sutures. The wound is dressed with benzoin Steri-Strips Telfa gauze and tape the patient was extubated in the operating room and taken to recovery in stable satisfactory condition. She tolerated the operation well  counts were correct at the end of the case.   PLAN OF CARE: Admit to inpatient   PATIENT DISPOSITION:  PACU -  hemodynamically stable.   Delay start of Pharmacological VTE agent (>24hrs) due to surgical blood loss or risk of bleeding: yes

## 2012-10-16 NOTE — Anesthesia Procedure Notes (Signed)
Procedure Name: Intubation Date/Time: 10/16/2012 7:59 AM Performed by: Brien Mates DOBSON Pre-anesthesia Checklist: Patient identified, Emergency Drugs available, Suction available, Patient being monitored and Timeout performed Patient Re-evaluated:Patient Re-evaluated prior to inductionOxygen Delivery Method: Circle system utilized Preoxygenation: Pre-oxygenation with 100% oxygen Intubation Type: IV induction Ventilation: Mask ventilation without difficulty and Oral airway inserted - appropriate to patient size Laryngoscope Size: Miller and 2 Grade View: Grade I Tube type: Oral Tube size: 7.5 mm Number of attempts: 1 Airway Equipment and Method: Stylet Placement Confirmation: ETT inserted through vocal cords under direct vision,  positive ETCO2 and breath sounds checked- equal and bilateral Secured at: 22 cm Tube secured with: Tape Dental Injury: Teeth and Oropharynx as per pre-operative assessment

## 2012-10-16 NOTE — Preoperative (Signed)
Beta Blockers   Reason not to administer Beta Blockers:Not Applicable 

## 2012-10-16 NOTE — Op Note (Signed)
10/16/2012  10:45 AM  PATIENT:  Cynthia Fitzgerald  57 y.o. female  PRE-OPERATIVE DIAGNOSIS:  Congenital spondylolisthesis, Congenital lumbar spondylolysis, Lumbar stenosis, Lumbar radiculopathy L5S1  POST-OPERATIVE DIAGNOSIS:  Congenital spondylolisthesis, Congenital lumbar spondylolysis, Lumbar stenosis, Lumbar radiculopathy L5S1   PROCEDURE:  Procedure(s) with comments: Lumbar Five Gill procedure with Lumbar Five to Sacral One Posterior Lumbar Fusion (N/A) - POSTERIOR LUMBAR FUSION 1 LEVEL PLIF, pedicle screw fixation and posterolateral arthrodesis  SURGEON:  Surgeon(s) and Role:    * Norman Bier, MD - Primary    * Kyle L Cabbell, MD - Assisting  PHYSICIAN ASSISTANT:   ASSISTANTS: Poteat, RN   ANESTHESIA:   general  EBL:  Total I/O In: 2500 [I.V.:2500] Out: 500 [Urine:350; Blood:150]  BLOOD ADMINISTERED:none  DRAINS: (Medium) Hemovact drain(s) in the epidural space with  Suction Open   LOCAL MEDICATIONS USED:  LIDOCAINE   SPECIMEN:  No Specimen  DISPOSITION OF SPECIMEN:  N/A  COUNTS:  YES  TOURNIQUET:  * No tourniquets in log *  DICTATION: Patient is 57-year-old woman with mobile spondylolisthesis of L5 on LS1 with lumbar stenosis and L5 spodylolysis. She has a severe bilateral L5 radiculopathies.  She is morbidly obese. It was elected to take her to surgery for decompression and fusion at this level.   Procedure: Patient was placed in a prone position on the [place] table after smooth and uncomplicated induction of general endotracheal anesthesia. Her low back was prepped and draped in usual sterile fashion with betadine scrub and DuraPrep. Area of incision was infiltrated with local lidocaine. Incision was made to the lumbodorsal fascia was incised and exposure was performed of the L5 through S1 spinous processes laminae facet joint and transverse processes. Intraoperative x-ray was obtained which confirmed correct orientation. A Gill procedure of L5 was performed with  disarticulation of the facet joints at this level and thorough decompression was performed of both L5 and S1 nerve roots along with the common dural tube. This decompression was more involved than would be typical of that performed for PLIF alone and included painstaking dissection of adherent ligament compressing the thecal sac and wide decompression of all neural elements. A thorough discectomy was initially performed on the left with preparation of the endplates for grafting a trial spacer was placed this level and a thorough discectomy was performed on the right as well. Bone autograft was packed within the interspace bilaterally along with small BMP kit and NexOss bone graft extender. Bilateral medium 9 mm peek cages were packed with BMP and extender and was inserted the interspace and countersunk appropriately along with 6 cc of morselized bone autograft. The posterolateral region was extensively decorticated and pedicle probes were placed at L5 and S1 bilaterally. Intraoperative fluoroscopy confirmed correct orientationin the AP and lateral plane. 40 x 6.5 mm pedicle screws were placed at S1 bilaterally and 50 x 5.5 mm screws placed at L5 bilaterally final x-rays demonstrated well-positioned interbody grafts and pedicle screw fixation.  35 mm lordotic rods were placed on the bilaterally and locked down in situ and the posterolateral region was packed with the remaining autograft and bone graft extender bilaterally. The wound was irrigated and a medium Hemovac drain was placed in the epidural space. Fascia was closed with 1 Vicryl sutures skin edges were reapproximated 2 and 3-0 Vicryl sutures. The wound is dressed with benzoin Steri-Strips Telfa gauze and tape the patient was extubated in the operating room and taken to recovery in stable satisfactory condition. She tolerated the operation well   counts were correct at the end of the case.   PLAN OF CARE: Admit to inpatient   PATIENT DISPOSITION:  PACU -  hemodynamically stable.   Delay start of Pharmacological VTE agent (>24hrs) due to surgical blood loss or risk of bleeding: yes  

## 2012-10-16 NOTE — Interval H&P Note (Signed)
History and Physical Interval Note:  10/16/2012 6:37 AM  Cynthia Fitzgerald  has presented today for surgery, with the diagnosis of Congenital spondylolisthesis, Congenital lumbar spondylosis, Lumbar stenosis, Lumbar radiculopathy  The various methods of treatment have been discussed with the patient and family. After consideration of risks, benefits and other options for treatment, the patient has consented to  Procedure(s) with comments: POSTERIOR LUMBAR FUSION 1 LEVEL (N/A) - L5 Gill procedure with L5-S1 Fusion as a surgical intervention .  The patient's history has been reviewed, patient examined, no change in status, stable for surgery.  I have reviewed the patient's chart and labs.  Questions were answered to the patient's satisfaction.     [name] D   

## 2012-10-16 NOTE — Interval H&P Note (Signed)
History and Physical Interval Note:  10/16/2012 6:37 AM  Cynthia Fitzgerald  has presented today for surgery, with the diagnosis of Congenital spondylolisthesis, Congenital lumbar spondylosis, Lumbar stenosis, Lumbar radiculopathy  The various methods of treatment have been discussed with the patient and family. After consideration of risks, benefits and other options for treatment, the patient has consented to  Procedure(s) with comments: POSTERIOR LUMBAR FUSION 1 LEVEL (N/A) - L5 Gill procedure with L5-S1 Fusion as a surgical intervention .  The patient's history has been reviewed, patient examined, no change in status, stable for surgery.  I have reviewed the patient's chart and labs.  Questions were answered to the patient's satisfaction.     Rei Medlen D

## 2012-10-16 NOTE — Progress Notes (Signed)
Patient arrived to unit via bed accompany with family. She c/o pain 5/10 but stated that is her comfortable level. Safety precautions and plans reviewed with patient and family. Bed alarm activated, bed on low position, call light within reach. Tray ordered. Will continue to monitor,.

## 2012-10-16 NOTE — Anesthesia Preprocedure Evaluation (Addendum)
Anesthesia Evaluation  Patient identified by MRN, date of birth, ID band Patient awake    Reviewed: Allergy & Precautions, H&P , NPO status , Patient's Chart, lab work & pertinent test results, reviewed documented beta blocker date and time   Airway Mallampati: I TM Distance: >3 FB Neck ROM: Full    Dental  (+) Teeth Intact and Dental Advisory Given   Pulmonary          Cardiovascular hypertension, Pt. on medications     Neuro/Psych  Headaches,    GI/Hepatic   Endo/Other    Renal/GU      Musculoskeletal   Abdominal   Peds  Hematology   Anesthesia Other Findings   Reproductive/Obstetrics                          Anesthesia Physical Anesthesia Plan  ASA: III  Anesthesia Plan: General   Post-op Pain Management:    Induction: Intravenous  Airway Management Planned: Oral ETT  Additional Equipment:   Intra-op Plan:   Post-operative Plan: Extubation in OR  Informed Consent: I have reviewed the patients History and Physical, chart, labs and discussed the procedure including the risks, benefits and alternatives for the proposed anesthesia with the patient or authorized representative who has indicated his/her understanding and acceptance.   Dental advisory given  Plan Discussed with: CRNA, Surgeon and Anesthesiologist  Anesthesia Plan Comments:        Anesthesia Quick Evaluation

## 2012-10-16 NOTE — Anesthesia Postprocedure Evaluation (Signed)
Anesthesia Post Note  Patient: Cynthia Fitzgerald  Procedure(s) Performed: Procedure(s) (LRB): Lumbar Five Gill procedure with Lumbar Five to Sacral One Posterior Lumbar Fusion (N/A)  Anesthesia type: general  Patient location: PACU  Post pain: Pain level controlled  Post assessment: Patient's Cardiovascular Status Stable  Last Vitals:  Filed Vitals:   10/16/12 1220  BP: 90/50  Pulse: 53  Temp:   Resp: 16    Post vital signs: Reviewed and stable  Level of consciousness: sedated  Complications: No apparent anesthesia complications

## 2012-10-16 NOTE — Progress Notes (Signed)
Awake, alert, conversant.  Leg pain improved.  Strength full both legs.  Doing well .

## 2012-10-17 MED ORDER — MANAGING BACK PAIN BOOK
Freq: Once | Status: AC
  Administered 2012-10-17: 06:00:00
  Filled 2012-10-17: qty 1

## 2012-10-17 NOTE — Progress Notes (Signed)
Subjective: Patient reports "I have some back pain. The morphine is helping."  Objective: Vital signs in last 24 hours: Temp:  [97 F (36.1 C)-98.2 F (36.8 C)] 97.7 F (36.5 C) (08/01 0500) Pulse Rate:  [53-74] 66 (08/01 0500) Resp:  [9-18] 18 (08/01 0500) BP: (88-127)/(40-82) 92/52 mmHg (08/01 0500) SpO2:  [95 %-100 %] 97 % (08/01 0500) Weight:  [122.97 kg (271 lb 1.6 oz)] 122.97 kg (271 lb 1.6 oz) (08/01 0500)  Intake/Output from previous day: 07/31 0701 - 08/01 0700 In: 5406.3 [P.O.:960; I.V.:4346.3; IV Piggyback:100] Out: 2870 [Urine:2550; Drains:170; Blood:150] Intake/Output this shift:    Alert, conversant, Husband present. Good strength BLE. Incision without erythema, swelling, or drainage. drsg intact. Hemovac patent ~1106ml overnight. Asymptomatic of 92/52 BP taken at 5am.   Lab Results: No results found for this basename: WBC, HGB, HCT, PLT,  in the last 72 hours BMET No results found for this basename: NA, K, CL, CO2, GLUCOSE, BUN, CREATININE, CALCIUM,  in the last 72 hours  Studies/Results: Dg Lumbar Spine 2-3 Views  10/16/2012   *RADIOLOGY REPORT*  Clinical Data: L5-S1 POSTERIOR FUSION.  DG C-ARM 1-60 MIN,LUMBAR SPINE - 2-3 VIEW  Comparison: 09/03/2012  Findings: Two intraoperative spot images demonstrate placement bilateral pedicle screws at L5 and S1.  Anterolisthesis of L5 on S1 again noted, stable.  IMPRESSION: Posterior fusion as above.   Original Report Authenticated By: Charlett Nose, M.D.   Dg Lumbar Spine 1 View  10/16/2012   *RADIOLOGY REPORT*  Clinical Data: L5-L1 PLIF.  LUMBAR SPINE - 1 VIEW  Comparison: 09/03/2012  Findings: Single lateral intraoperative image of the lumbosacral spine demonstrates mild moderate spondylosis.  Vertebral body heights are maintained.  There is evidence of patient's known grade I-II anterolisthesis of L5 on S1. Disc space narrowing at the L5-L1 level.  Moderate facet arthropathy of the lower lumbar spine. Surgical instruments are  present over the posterior elements at the L5-L1 level. Evidence of known L5 spondylolysis.  Recommend correlation with findings at the time of the procedure.   Original Report Authenticated By: Elberta Fortis, M.D.   Dg C-arm 1-60 Min  10/16/2012   *RADIOLOGY REPORT*  Clinical Data: L5-S1 POSTERIOR FUSION.  DG C-ARM 1-60 MIN,LUMBAR SPINE - 2-3 VIEW  Comparison: 09/03/2012  Findings: Two intraoperative spot images demonstrate placement bilateral pedicle screws at L5 and S1.  Anterolisthesis of L5 on S1 again noted, stable.  IMPRESSION: Posterior fusion as above.   Original Report Authenticated By: Charlett Nose, M.D.    Assessment/Plan: Improving   LOS: 1 day  Mobilize in LSO with PT. Encouraged pt to request po meds for pain control.   Georgiann Cocker 10/17/2012, 8:25 AM

## 2012-10-17 NOTE — Progress Notes (Signed)
   CARE MANAGEMENT NOTE 10/17/2012  Patient:  Cynthia Fitzgerald, Cynthia Fitzgerald   Account Number:  [id number]  Date Initiated:  10/17/2012  Documentation initiated by:  Jiles Crocker  Subjective/Objective Assessment:   ADMITTED FOR SURGERY - POSTERIOR LUMBAR FUSION 1 LEVEL PLIF     Action/Plan:   LIVES AT HOME WITH SPOUSE; CM FOLLOWING FOR DCP   Anticipated DC Date:  10/20/2012   Anticipated DC Plan:  HOME/SELF CARE      DC Planning Services  CM consult         Status of service:  In process, will continue to follow Medicare Important Message given?  NA - LOS <3 / Initial given by admissions (If response is "NO", the following Medicare IM given date fields will be blank)  Per UR Regulation:  Reviewed for med. necessity/level of care/duration of stay  Comments:  10/17/2012- B Uno Esau RN,BSN,MHA

## 2012-10-17 NOTE — Progress Notes (Signed)
Progressing well.

## 2012-10-17 NOTE — Evaluation (Addendum)
Physical Therapy Evaluation Patient Details Name: Cynthia Fitzgerald MRN: [id number] DOB: 10/10/1955 Today's Date: 10/17/2012 Time: 8295-6213 PT Time Calculation (min): 42 min  PT Assessment / Plan / Recommendation History of Present Illness  Pt is a 57 y/o female s/p posterior lumbar fusion L5-S1.  Clinical Impression  Pt is POD #1and is mobilizing well with little external physical assistance.  She will need to practice 3 STE with no rails to be able to get home safely before d/c.  Reinforce back precaution education and functional examples.      PT Assessment  Patient needs continued PT services    Follow Up Recommendations  Home health PT;Supervision for mobility/OOB    Does the patient have the potential to tolerate intense rehabilitation     NA  Barriers to Discharge   None None    Equipment Recommendations  None recommended by PT    Recommendations for Other Services   None  Frequency Min 5X/week    Precautions / Restrictions Precautions Precautions: Back Precaution Booklet Issued: Yes (comment) Precaution Comments: Reviewed back precautions and brace use as well a log roll/bed mobility precautions.  OT gave handout earlier today Required Braces or Orthoses: Spinal Brace Spinal Brace: Lumbar corset;Applied in sitting position Restrictions Weight Bearing Restrictions: No   Pertinent Vitals/Pain See vitals flow sheet.       Mobility  Bed Mobility Bed Mobility: Rolling Right;Right Sidelying to Sit;Sitting - Scoot to Edge of Bed;Sit to Sidelying Right;Scooting to [facility] Rolling Right: 5: Supervision;With rail Right Sidelying to Sit: 5: Supervision;With rails;HOB elevated Sitting - Scoot to Edge of Bed: 5: Supervision;With rail Sit to Sidelying Right: 5: Supervision;With rail;HOB flat Scooting to HOB: 5: Supervision;With rail Details for Bed Mobility Assistance: Cues for safety to remind pt of log roll technique.  Verbal cues for sleeping wtih pillow between her knees  (she is a side sleeper).   Transfers Transfers: Sit to Stand;Stand to Sit Sit to Stand: 4: Min guard;With upper extremity assist;From bed Stand to Sit: 4: Min guard;With upper extremity assist;To bed Details for Transfer Assistance: min guard assist for safety due to slow moving during transitions Ambulation/Gait Ambulation/Gait Assistance: 4: Min guard Ambulation Distance (Feet): 400 Feet Assistive device: Rolling walker Ambulation/Gait Assistance Details: Pt completed an entire lap around the unit with slow, steady gait.  Min guard assist for safety as pt did still feel a little weak overall.  Chair to follow for safety and to encourage increased gait distance.  Encouranged upright posture and decreased reliance on hands on RW for support.   Gait Pattern: Step-through pattern;Trunk flexed        PT Diagnosis: Difficulty walking;Abnormality of gait;Generalized weakness;Acute pain  PT Problem List: Decreased strength;Decreased activity tolerance;Decreased mobility;Decreased balance;Decreased knowledge of use of DME;Decreased knowledge of precautions;Obesity;Pain PT Treatment Interventions: DME instruction;Gait training;Stair training;Therapeutic activities;Functional mobility training;Balance training;Therapeutic exercise;Neuromuscular re-education;Patient/family education;Modalities     PT Goals(Current goals can be found in the care plan section) Acute Rehab PT Goals Patient Stated Goal: Return home with husband assist PRN. PT Goal Formulation: With patient/family Time For Goal Achievement: 10/24/12 Potential to Achieve Goals: Good  Visit Information  Last PT Received On: 10/17/12 Assistance Needed: +1 History of Present Illness: Pt is a 57 y/o female s/p posterior lumbar fusion L5-S1.       Prior Functioning  Home Living Family/patient expects to be discharged to:: Private residence Living Arrangements: Spouse/significant other Available Help at Discharge: Family;Available 24  hours/day Type of Home: House Home Access: Stairs to  enter Entrance Stairs-Number of Steps: 3 STE Entrance Stairs-Rails: None Home Layout: One level Home Equipment: Shower seat - built in;Other (comment);Hand held shower head;Walker - 2 wheels (handicap height toilet) Additional Comments: Pt will need 3:1 at d/c home  Prior Function Level of Independence: Independent Comments: Pt works full time running a business, 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  Is intersted in an ergonomic assessment when she returns to work.   Communication Communication: No difficulties Dominant Hand: Right    Cognition  Cognition Arousal/Alertness: Awake/alert Behavior During Therapy: WFL for tasks assessed/performed Overall Cognitive Status: Within Functional Limits for tasks assessed    Extremity/Trunk Assessment Upper Extremity Assessment Upper Extremity Assessment: Defer to OT evaluation Lower Extremity Assessment Lower Extremity Assessment: Generalized weakness (due to post-op pain and decreased BP) Cervical / Trunk Assessment Cervical / Trunk Assessment: Normal   Balance Balance Balance Assessed: Yes Static Sitting Balance Static Sitting - Balance Support: Feet supported Static Standing Balance Static Standing - Balance Support: No upper extremity supported;During functional activity  End of Session PT - End of Session Equipment Utilized During Treatment: Back brace Activity Tolerance: Patient limited by fatigue;Patient limited by lethargy;Patient limited by pain Patient left: in bed;with call bell/phone within reach;with family/visitor present (husband in room)    Lurena Joiner B. Bud Kaeser, PT, DPT [phone]   10/17/2012, 2:42 PM

## 2012-10-17 NOTE — Evaluation (Signed)
Occupational Therapy Evaluation Patient Details Name: TERRIN IMPARATO MRN: [id number] DOB: [birth year] Today's Date: 10/17/2012 Time: 4098-1191 OT Time Calculation (min): 43 min  OT Assessment / Plan / Recommendation History of present illness Pt is a 57 y/o female s/p posterior lumbar fusion L5-S1. She presents with decreased ability to perform ADL's and self care & will benefit from acute OT, followed by [facility] OT. Pt needs 3:1 for anticipated d/c home w/ husband 24/7 assist.   Clinical Impression   Pt will benefit from acute OT services to address deficits in ADL's and self care. She will need 3:1 at home and [facility] OT for home assessment and recommendations. She plans to d/c home w/ husband assist when medically able.   OT Assessment  Patient needs continued OT Services    Follow Up Recommendations  Home health OT    Barriers to Discharge      Equipment Recommendations  3 in 1 bedside comode    Recommendations for Other Services    Frequency  Min 2X/week    Precautions / Restrictions Precautions Precautions: Back Precaution Booklet Issued: Yes (comment) Precaution Comments: Handout issued and reviewed w/ pt/husband Required Braces or Orthoses: Spinal Brace Spinal Brace: Lumbar corset;Applied in sitting position Restrictions Weight Bearing Restrictions: No   Pertinent Vitals/Pain Pt reports low back pain, surgical pain/aching. She states that she had morphine "earlier today."    ADL  Eating/Feeding: Simulated;Modified independent Where Assessed - Eating/Feeding: Bed level Grooming: Performed;Wash/dry hands;Teeth care;Wash/dry face;Modified independent;Supervision/safety Where Assessed - Grooming: Unsupported sitting;Supported standing Upper Body Bathing: Simulated;Set up Where Assessed - Upper Body Bathing: Supported sitting Lower Body Bathing: Simulated;Minimal assistance Where Assessed - Lower Body Bathing: Supported sit to stand;Supported sitting Upper Body Dressing:  Simulated;Set up Where Assessed - Upper Body Dressing: Supported sitting Lower Body Dressing: Performed;Moderate assistance Where Assessed - Lower Body Dressing: Supported sit to stand;Supported sitting Toilet Transfer: Performed;Min Pension scheme manager Method: Sit to Barista: Raised toilet seat with arms (or 3-in-1 over toilet);Other (comment) (RW) Toileting - Clothing Manipulation and Hygiene: Performed;Supervision/safety Where Assessed - Engineer, mining and Hygiene: Standing;Sit to stand from 3-in-1 or toilet Tub/Shower Transfer: Other (comment) (Pt has walk in shower at home w/ door) Tub/Shower Transfer Method: Not assessed Equipment Used: Back brace;Gait belt;Rolling walker;Other (comment) (3:1) Transfers/Ambulation Related to ADLs: Pt required occasional vc's for RW safety and sequencing. Overall supervision level for functional ambulation & Min A transfers related to ADL's ADL Comments: Pt stood at sink for grooming tasks after toilet transfer w/ min A. She will benefit from 3:1 at home and [facility] OT for home assessment and recommendations. She plans to d/c home w/ husband assist when medically able.     OT Diagnosis: Generalized weakness;Acute pain  OT Problem List: Decreased activity tolerance;Decreased knowledge of use of DME or AE;Pain OT Treatment Interventions: Self-care/ADL training;DME and/or AE instruction;Therapeutic activities;Patient/family education   OT Goals(Current goals can be found in the care plan section) Acute Rehab OT Goals Patient Stated Goal: Return home with husband assist PRN. OT Goal Formulation: With patient/family Time For Goal Achievement: 10/24/12 Potential to Achieve Goals: Good ADL Goals Pt Will Perform Lower Body Dressing: with supervision;with set-up;with adaptive equipment;sit to/from stand Pt Will Transfer to Toilet: with supervision;ambulating;bedside commode (3:1 over toilet) Pt Will Perform Toileting -  Clothing Manipulation and hygiene: with supervision (Maintaining back precautions) Pt Will Perform Tub/Shower Transfer: Shower transfer;with supervision;ambulating;shower seat;rolling walker  Visit Information  Last OT Received On: 10/17/12 Assistance Needed: +1 History of  Present Illness: Pt is a 57 y/o female s/p posterior lumbar fusion L5-S1. She presents with decreased ability to perform ADL's and self care & will benefit from acute OT, followed by [facility] OT. Pt needs 3:1 for anticipated d/c home w/ husband 24/7 assist.       Prior Functioning     Home Living Family/patient expects to be discharged to:: Private residence Living Arrangements: Spouse/significant other Available Help at Discharge: Family;Available 24 hours/day Type of Home: House Home Access: Stairs to enter [employer] of Steps: 3 STE Entrance Stairs-Rails: None Home Layout: One level Home Equipment: Shower seat - built in;Other (comment);Hand held shower head;Walker - 2 wheels (handicapped height toilet) Additional Comments: Pt will need 3:1 at d/c home  Prior Function Level of Independence: Independent Communication Communication: No difficulties Dominant Hand: Right    Vision/Perception Vision - History Patient Visual Report: No change from baseline Vision - Assessment Eye Alignment: Within Functional Limits   Cognition  Cognition Arousal/Alertness: Awake/alert Behavior During Therapy: WFL for tasks assessed/performed Overall Cognitive Status: Within Functional Limits for tasks assessed    Extremity/Trunk Assessment Upper Extremity Assessment Upper Extremity Assessment: Overall WFL for tasks assessed Lower Extremity Assessment Lower Extremity Assessment: Overall WFL for tasks assessed Cervical / Trunk Assessment Cervical / Trunk Assessment: Normal     Mobility Bed Mobility Bed Mobility: Rolling Right;Right Sidelying to Sit;Sitting - Scoot to Edge of Bed Rolling Right: 4: Min guard;With  rail Right Sidelying to Sit: 4: Min guard;HOB flat Sitting - Scoot to Edge of Bed: 5: Supervision Transfers Transfers: Sit to Stand;Stand to Sit Sit to Stand: 4: Min guard;From bed;From chair/3-in-1;From toilet;With upper extremity assist;With armrests Stand to Sit: 4: Min guard;With upper extremity assist;With armrests;To chair/3-in-1;To toilet        Balance Balance Balance Assessed: Yes Static Sitting Balance Static Sitting - Balance Support: Feet supported Static Standing Balance Static Standing - Balance Support: No upper extremity supported;During functional activity   End of Session OT - End of Session Equipment Utilized During Treatment: Gait belt;Rolling walker;Back brace Activity Tolerance: Patient tolerated treatment well Patient left: in chair;with call bell/phone within reach;with family/visitor present Nurse Communication: Other (comment) (IV complete/beeping)  GO     [place], Amy Beth Dixon 10/17/2012, 11:36 AM

## 2012-10-18 MED ORDER — PANTOPRAZOLE SODIUM 40 MG PO TBEC
40.0000 mg | DELAYED_RELEASE_TABLET | Freq: Every day | ORAL | Status: DC
  Administered 2012-10-18: 40 mg via ORAL
  Filled 2012-10-18: qty 1

## 2012-10-18 NOTE — Progress Notes (Signed)
Patient ID: Cynthia Fitzgerald, female   DOB: September 28, 1955, 57 y.o.   MRN: [id number] Afeb. vss Slowly increasing activity. Wound clean. Will remove drain today. Home next 1 to 2 days.

## 2012-10-18 NOTE — Progress Notes (Addendum)
HemoVac D/C per order, 90 cc drainage out, pt tolerated well, site intact, honeycomb dressing reapplied.

## 2012-10-18 NOTE — Progress Notes (Signed)
Occupational Therapy Treatment Patient Details Name: Cynthia Fitzgerald MRN: [id number] DOB: 22-Jun-1955 Today's Date: 10/18/2012 Time: 4098-1191 OT Time Calculation (min): 30 min  OT Assessment / Plan / Recommendation  History of present illness Pt is a 57 y/o female s/p posterior lumbar fusion L5-S1.   OT comments  Practiced with reacher for LB dressing. Educated on use of toilet aid for hygiene and also AE that comes in kit to assist with LB ADLs.   Follow Up Recommendations  Home health OT    Barriers to Discharge       Equipment Recommendations  3 in 1 bedside comode    Recommendations for Other Services    Frequency Min 2X/week   Progress towards OT Goals Progress towards OT goals: Progressing toward goals  Plan Discharge plan remains appropriate    Precautions / Restrictions Precautions Precautions: Back Precaution Booklet Issued: No Precaution Comments: reviewed back precautions; pt able to recall 2/3 independently  Required Braces or Orthoses: Spinal Brace Spinal Brace: Lumbar corset;Applied in sitting position Restrictions Weight Bearing Restrictions: No   Pertinent Vitals/Pain 5/10; premedicated.     ADL  Grooming: Performed;Wash/dry hands;Supervision/safety Where Assessed - Grooming: Unsupported standing Lower Body Dressing: Min guard Where Assessed - Lower Body Dressing: Supported sit to Pharmacist, hospital: Supervision/safety Statistician Method: Sit to Barista: Raised toilet seat with arms (or 3-in-1 over toilet) Toileting - Clothing Manipulation and Hygiene: Moderate assistance Where Assessed - Toileting Clothing Manipulation and Hygiene: Sit to stand from 3-in-1 or toilet Equipment Used: Back brace;Rolling walker;Sock aid;Reacher;Long-handled sponge;Long-handled shoe horn Transfers/Ambulation Related to ADLs: Supervision level ADL Comments: OT educated on AE for LB ADLs. Pt practiced with reacher donning pants- [place] level.  OT discussed and recommended toilet aid for hygiene. Pt at overall Mod A level for clothing/hygiene. Pt able to perform clothing management, but required assistance for hygiene as she stated at beginning of session this was difficult for her to do while maintaining precautions.  Talked about shower transfer and recommending using 3 in 1 in shower. Will practice tomorrow.    OT Diagnosis:    OT Problem List:   OT Treatment Interventions:     OT Goals(current goals can now be found in the care plan section) Acute Rehab OT Goals Patient Stated Goal: home with husband tomorrow or monday OT Goal Formulation: With patient/family Time For Goal Achievement: 10/24/12 Potential to Achieve Goals: Good ADL Goals Pt Will Perform Lower Body Dressing: with supervision;with set-up;with adaptive equipment;sit to/from stand Pt Will Transfer to Toilet: with supervision;ambulating;bedside commode (3:1 over toilet) Pt Will Perform Toileting - Clothing Manipulation and hygiene: with supervision (maintaining back precautions) Pt Will Perform Tub/Shower Transfer: Shower transfer;with supervision;ambulating;shower seat;rolling walker  Visit Information  Last OT Received On: 10/18/12 Assistance Needed: +1 PT/OT Co-Evaluation/Treatment: Yes (partly) History of Present Illness: Pt is a 57 y/o female s/p posterior lumbar fusion L5-S1.    Subjective Data      Prior Functioning       Cognition  Cognition Arousal/Alertness: Awake/alert Behavior During Therapy: WFL for tasks assessed/performed Overall Cognitive Status: Within Functional Limits for tasks assessed    Mobility  Bed Mobility Bed Mobility: Right Sidelying to Sit Right Sidelying to Sit: 5: Supervision;With rails;HOB elevated Details for Bed Mobility Assistance: pt relies on rails; requires increased time and vc's for sequencing and log rolling technique  Transfers Transfers: Sit to Stand;Stand to Sit Sit to Stand: 5: Supervision;From bed;With  upper extremity assist Stand to Sit: 5: Supervision;To chair/3-in-1;With  upper extremity assist Details for Transfer Assistance: min cues for hand placement and sequencing; pt attempted to pull to stand on RW; supervision for cues and safety          End of Session OT - End of Session Equipment Utilized During Treatment: Rolling walker;Back brace Activity Tolerance: Patient tolerated treatment well Patient left: in chair;with call bell/phone within reach;with family/visitor present  GO     Earlie Raveling OTR/L [phone] 10/18/2012, 3:02 PM

## 2012-10-18 NOTE — Progress Notes (Signed)
Physical Therapy Treatment Patient Details Name: Cynthia Fitzgerald MRN: [id number] DOB: 14-Jun-1955 Today's Date: 10/18/2012 Time: 4098-1191 PT Time Calculation (min): 17 min  PT Assessment / Plan / Recommendation  History of Present Illness Pt is a 57 y/o female s/p posterior lumbar fusion L5-S1.   PT Comments   Pt progressing well with therapy; limited today due to dizziness and feeling "groggy".  Pt believes the feeling is due to medicine. Will attempt to see tomorrow and possible D/C home from mobility standpoint.   Follow Up Recommendations  Home health PT;Supervision for mobility/OOB     Does the patient have the potential to tolerate intense rehabilitation     Barriers to Discharge        Equipment Recommendations  None recommended by PT    Recommendations for Other Services    Frequency Min 5X/week   Progress towards PT Goals Progress towards PT goals: Progressing toward goals  Plan Current plan remains appropriate    Precautions / Restrictions Precautions Precautions: Back Precaution Comments: reviewed back precautions; pt able to recall 2/3 independently  Required Braces or Orthoses: Spinal Brace Spinal Brace: Lumbar corset;Applied in sitting position Restrictions Weight Bearing Restrictions: No   Pertinent Vitals/Pain 5/10; premedicated     Mobility  Bed Mobility Bed Mobility: Right Sidelying to Sit Right Sidelying to Sit: 5: Supervision;With rails;HOB elevated Details for Bed Mobility Assistance: pt relies on rails; requires increased time and vc's for sequencing and log rolling technique  Transfers Transfers: Sit to Stand;Stand to Sit Sit to Stand: 5: Supervision;From bed Stand to Sit: 5: Supervision;To chair/3-in-1 Details for Transfer Assistance: min cues for hand placement and sequencing; pt attempted to pull to stand on RW; supervision for cues and safety Ambulation/Gait Ambulation/Gait Assistance: 5: Supervision Ambulation Distance (Feet): 200  Feet Assistive device: Standard walker Ambulation/Gait Assistance Details: amb initially with RW; progressed to amb without AD; pt unsteady and feels more stable with RW; vc's for upright posture  Gait Pattern: Step-through pattern;Trunk flexed Gait velocity: decreased Stairs: Yes Stairs Assistance: 4: Min guard Stairs Assistance Details (indicate cue type and reason): vc's for gt sequencing and safety; +1 HHA due to lack of rails. demo and educated husband on proper technique to (A) pt  Stair Management Technique: No rails;Forwards;Step to pattern Number of Stairs: 2 Wheelchair Mobility Wheelchair Mobility: No         PT Diagnosis:    PT Problem List:   PT Treatment Interventions:     PT Goals (current goals can now be found in the care plan section) Acute Rehab PT Goals Patient Stated Goal: home with husband tomorrow or monday PT Goal Formulation: With patient/family Time For Goal Achievement: 10/24/12 Potential to Achieve Goals: Good  Visit Information  Last PT Received On: 10/18/12 Assistance Needed: +1 PT/OT Co-Evaluation/Treatment: Yes History of Present Illness: Pt is a 57 y/o female s/p posterior lumbar fusion L5-S1.    Subjective Data  Subjective: pt sidelying with husband present; agreeable to therapy  Patient Stated Goal: home with husband tomorrow or monday   Cognition  Cognition Arousal/Alertness: Awake/alert Behavior During Therapy: WFL for tasks assessed/performed Overall Cognitive Status: Within Functional Limits for tasks assessed    Balance  Balance Balance Assessed: No  End of Session PT - End of Session Equipment Utilized During Treatment: Back brace Activity Tolerance: Patient tolerated treatment well Patient left: in chair;with family/visitor present;Other (comment) (OT present) Nurse Communication: Mobility status   GP     Donell Sievert, [place] [phone] 10/18/2012, 1:09  PM   

## 2012-10-19 MED ORDER — HYDROCODONE-ACETAMINOPHEN 5-325 MG PO TABS: 1.0000 | ORAL_TABLET | ORAL | Status: DC | PRN

## 2012-10-19 MED ORDER — DSS 100 MG PO CAPS: 100.0000 mg | ORAL_CAPSULE | Freq: Two times a day (BID) | ORAL | Status: DC

## 2012-10-19 MED ORDER — DIAZEPAM 5 MG PO TABS: 5.0000 mg | ORAL_TABLET | Freq: Four times a day (QID) | ORAL | Status: DC | PRN

## 2012-10-19 MED ORDER — OXYCODONE-ACETAMINOPHEN 5-325 MG PO TABS: 1.0000 | ORAL_TABLET | ORAL | Status: DC | PRN

## 2012-10-19 NOTE — Progress Notes (Signed)
Pt had BM this am, reports less pain than night before, overall feels much better.

## 2012-10-19 NOTE — Discharge Summary (Signed)
Physician Discharge Summary  Patient ID: Cynthia Fitzgerald MRN: [id number] DOB/AGE: 58-Jan-1957 58 y.o.  Admit date: 10/16/2012 Discharge date: 10/19/2012  Admission Diagnoses:L5-S1 spondylolisthesis, lumbago, lumbar radiculopathy  Discharge Diagnoses: the same Active Problems:   * No active hospital problems. *   Discharged Condition: good  Hospital Course: Dr. Geoffery Lyons performed at L5-S1 decompression and fusion on the patient on 10/16/2012. The patient's postoperative course was unremarkable. She requested discharge to home on 10/19/2012. The patient was given oral and written discharge instructions. All her questions were answered.  Consults:none Significant Diagnostic Studies:none Treatments:L5-S1 decompression, instrumentation, and fusion Discharge Exam: Blood pressure 122/65, pulse 87, temperature 97.5 F (36.4 C), temperature source Oral, resp. rate 20, height 5\' 6"  (1.676 m), weight 122.97 kg (271 lb 1.6 oz), SpO2 99.00%. The patient is alert and oriented. Her wound is healing well. Her strength is normal.  Disposition: home  Discharge Orders   Future Orders Complete By Expires     Call MD for:  difficulty breathing, headache or visual disturbances  As directed     Call MD for:  extreme fatigue  As directed     Call MD for:  hives  As directed     Call MD for:  persistant dizziness or light-headedness  As directed     Call MD for:  persistant nausea and vomiting  As directed     Call MD for:  redness, tenderness, or signs of infection (pain, swelling, redness, odor or green/yellow discharge around incision site)  As directed     Call MD for:  severe uncontrolled pain  As directed     Call MD for:  temperature >100.4  As directed     Diet - low sodium heart healthy  As directed     Discharge instructions  As directed     Comments:      Call [phone] for a followup appointment. Take a stool softener while you are using pain medications.    Driving Restrictions  As  directed     Comments:      Do not drive for 2 weeks.    Increase activity slowly  As directed     Lifting restrictions  As directed     Comments:      Do not lift more than 5 pounds. No excessive bending or twisting.    May shower / Bathe  As directed     Comments:      He may shower after the pain she is removed 3 days after surgery. Leave the incision alone.    No dressing needed  As directed         Medication List    STOP taking these medications       ibuprofen 200 MG tablet  Commonly known as:  ADVIL,MOTRIN      TAKE these medications       diazepam 5 MG tablet  Commonly known as:  VALIUM  Take 1 tablet (5 mg total) by mouth every 6 (six) hours as needed.     DSS 100 MG Caps  Take 100 mg by mouth 2 (two) times daily.     methylPREDNISolone acetate 40 MG/ML injection  Commonly known as:  DEPO-MEDROL  Inject 40 mg into the muscle every 6 (six) months. Take 3 injection every 6 month period, one injection every 2 month     methylPREDNISolone acetate 80 MG/ML injection  Commonly known as:  DEPO-MEDROL  Inject 80 mg into the muscle every 6 (six)  months. Take 80mg  injection every 2 months     oxyCODONE-acetaminophen 5-325 MG per tablet  Commonly known as:  PERCOCET/ROXICET  Take 1-2 tablets by mouth every 4 (four) hours as needed.         SignedTressie Stalker D 10/19/2012, 11:18 AM

## 2012-10-19 NOTE — Progress Notes (Signed)
Pt for discharge home today with home health PT, IV D/C, lower back dressing D/C.  D/C instructions and Rx given with verbalized understanding.  Family at bedside to assist pt with discharge. Staff brought pt downstairs via wheelchair.

## 2012-10-19 NOTE — Progress Notes (Signed)
Physical Therapy Treatment Patient Details Name: MALAYIA SPIZZIRRI MRN: [id number] DOB: [birth year] Today's Date: 10/19/2012 Time: 1308-6578 PT Time Calculation (min): 24 min  PT Assessment / Plan / Recommendation  History of Present Illness Pt is a 57 y/o female s/p posterior lumbar fusion L5-S1.   PT Comments   Pt able to progress mobility without AD. Educated husband and pt on bed mobility, car transfer and stair amb. Pt and husband able to demo back proper technique and ability to adhere to back precautions. Pt to benefit from HHPT visit to address home setup and ergonomics.  All goals and education completed from acute PT standpoint at this time.   Follow Up Recommendations  Home health PT;Supervision for mobility/OOB     Does the patient have the potential to tolerate intense rehabilitation     Barriers to Discharge        Equipment Recommendations  None recommended by PT    Recommendations for Other Services    Frequency Min 5X/week   Progress towards PT Goals Progress towards PT goals: Goals met/education completed, patient discharged from PT  Plan Current plan remains appropriate    Precautions / Restrictions Precautions Precautions: Back Precaution Comments: pt able to independently recall 3/3 precautions  Required Braces or Orthoses: Spinal Brace Spinal Brace: Lumbar corset;Applied in sitting position Restrictions Weight Bearing Restrictions: No   Pertinent Vitals/Pain 2/10    Mobility  Bed Mobility Bed Mobility: Rolling Right;Rolling Left;Right Sidelying to Sit;Left Sidelying to Sit;Sit to Sidelying Right Rolling Right: 5: Supervision Rolling Left: 5: Supervision Right Sidelying to Sit: 5: Supervision;HOB flat Left Sidelying to Sit: 5: Supervision;HOB flat Sitting - Scoot to Edge of Bed: 6: Modified independent (Device/Increase time) Sit to Sidelying Right: 5: Supervision;HOB flat Details for Bed Mobility Assistance: supervision for min cues for log rolling  technique; simulated bed mobility at home with HOB flat; pt requires increased time to complete tasks due to pain and difficulty initiating roll; performed x2 to increase carryover and practice proper technique  Transfers Transfers: Sit to Stand;Stand to Sit Sit to Stand: 6: Modified independent (Device/Increase time);From bed;From chair/3-in-1;With armrests Stand to Sit: 6: Modified independent (Device/Increase time);To bed Details for Transfer Assistance: pt requires increased time to complete tasks due to pain; demo good technique and ability to maintain back precautions  Ambulation/Gait Ambulation/Gait Assistance: 6: Modified independent (Device/Increase time) Ambulation Distance (Feet): 400 Feet Assistive device: None Ambulation/Gait Assistance Details: pt demo good technique and safety awareness with gt today; does not require AD but encouraged to amb with one at home if she feels unsteady; pt demo ability to maintain back precautions with turns and when challeneged dynamically Gait Pattern: Within Functional Limits Gait velocity: able to increase slightly today Stairs: Yes Stairs Assistance: 5: Supervision Stairs Assistance Details (indicate cue type and reason): pt and husband able to demo proper technique with min cues; husband able to provide HHA to increase safety Stair Management Technique: No rails;Forwards;Step to pattern Number of Stairs: 5 Wheelchair Mobility Wheelchair Mobility: No    Education    discussed car transfer technique to avoid twisting; provided demo and verbal cues; pt and husband verbalized understanding.    PT Diagnosis:    PT Problem List:   PT Treatment Interventions:     PT Goals (current goals can now be found in the care plan section) Acute Rehab PT Goals Patient Stated Goal: home with husband tomorrow or monday PT Goal Formulation: With patient/family Potential to Achieve Goals: Good  Visit Information  Last  PT Received On: 10/19/12 Assistance  Needed: +1 PT/OT Co-Evaluation/Treatment: Yes History of Present Illness: Pt is a 57 y/o female s/p posterior lumbar fusion L5-S1.    Subjective Data  Subjective: pt sitting in chair with husband present; agreeable to therapy  Patient Stated Goal: home with husband tomorrow or monday   Cognition  Cognition Arousal/Alertness: Awake/alert Behavior During Therapy: WFL for tasks assessed/performed Overall Cognitive Status: Within Functional Limits for tasks assessed    Balance  Balance Balance Assessed: No  End of Session PT - End of Session Equipment Utilized During Treatment: Back brace Activity Tolerance: Patient tolerated treatment well Patient left: Other (comment);with family/visitor present (WITH OT ) Nurse Communication: Mobility status   GP     Donell Sievert, [place] [phone] 10/19/2012, 11:04 AM

## 2012-10-19 NOTE — Progress Notes (Signed)
Occupational Therapy Treatment Patient Details Name: Cynthia Fitzgerald MRN: [id number] DOB: 09/05/1955 Today's Date: 10/19/2012 Time: 4401-0272 OT Time Calculation (min): 29 min  OT Assessment / Plan / Recommendation  History of present illness Pt is a 57 y/o female s/p posterior lumbar fusion L5-S1.   OT comments  Pt practiced simulated shower transfer, using reacher for LB dressing, and bed mobility. Pt verbalized and demonstrated understanding. Feel pt is safe to d/c home with family available to assist 24/7.  Follow Up Recommendations  No OT follow up;Supervision/Assistance - 24 hour    Barriers to Discharge       Equipment Recommendations  3 in 1 bedside comode    Recommendations for Other Services    Frequency Min 2X/week   Progress towards OT Goals Progress towards OT goals: Progressing toward goals  Plan Discharge plan needs to be updated    Precautions / Restrictions Precautions Precautions: Back Precaution Booklet Issued: No Precaution Comments: pt able to independently recall 3/3 precautions  Required Braces or Orthoses: Spinal Brace Spinal Brace: Lumbar corset;Applied in sitting position Restrictions Weight Bearing Restrictions: No   Pertinent Vitals/Pain Pain 2/10. Increased activity.    ADL  Lower Body Dressing: Performed;Supervision/safety Where Assessed - Lower Body Dressing: Unsupported sit to stand Toilet Transfer: Modified independent Toilet Transfer Method: Sit to stand Toilet Transfer Equipment: Raised toilet seat with arms (or 3-in-1 over toilet) Tub/Shower Transfer: Simulated;Min guard Tub/Shower Transfer Method: Science writer: Walk in shower;Other (comment) (3 in 1) Equipment Used: Rolling walker;Reacher;Back brace ADL Comments: Pt practiced donning pants sitting EOB with reacher and also doffing socks at supervison level. Pt performed simulated shower transfer at [place] level. Educated to have someone with her 24/7.  Also, practiced bed mobility during session.    OT Diagnosis:    OT Problem List:   OT Treatment Interventions:     OT Goals(current goals can now be found in the care plan section) Acute Rehab OT Goals Patient Stated Goal: home with husband tomorrow or monday OT Goal Formulation: With patient/family Time For Goal Achievement: 10/24/12 Potential to Achieve Goals: Good ADL Goals Pt Will Perform Lower Body Dressing: with supervision;with set-up;with adaptive equipment;sit to/from stand Pt Will Transfer to Toilet: with supervision;ambulating;bedside commode (3:1 over toilet) Pt Will Perform Toileting - Clothing Manipulation and hygiene: with supervision (maintaining back precautions) Pt Will Perform Tub/Shower Transfer: Shower transfer;with supervision;ambulating;shower seat;rolling walker  Visit Information  Last OT Received On: 10/19/12 Assistance Needed: +1 PT/OT Co-Evaluation/Treatment: Yes History of Present Illness: Pt is a 57 y/o female s/p posterior lumbar fusion L5-S1.    Subjective Data      Prior Functioning       Cognition  Cognition Arousal/Alertness: Awake/alert Behavior During Therapy: WFL for tasks assessed/performed Overall Cognitive Status: Within Functional Limits for tasks assessed    Mobility  Bed Mobility Bed Mobility: Rolling Right;Rolling Left;Right Sidelying to Sit;Left Sidelying to Sit;Sit to Sidelying Right Rolling Right: 5: Supervision Rolling Left: 5: Supervision Right Sidelying to Sit: 5: Supervision;HOB flat Left Sidelying to Sit: 5: Supervision;HOB flat Sitting - Scoot to Edge of Bed: 6: Modified independent (Device/Increase time) Sit to Sidelying Right: 5: Supervision;HOB flat Scooting to HOB: 5: Supervision;With rail Details for Bed Mobility Assistance: supervision for min cues for log rolling technique; simulated bed mobility at home with HOB flat; pt requires increased time to complete tasks due to pain and difficulty initiating roll;  performed x2 to increase carryover and practice proper technique  Transfers Transfers: Sit to Stand;Stand to  Sit Sit to Stand: 6: Modified independent (Device/Increase time);From bed;From chair/3-in-1;With armrests Stand to Sit: 6: Modified independent (Device/Increase time);To bed;To chair/3-in-1 Details for Transfer Assistance: pt requires increased time to complete tasks due to pain; demo good technique and ability to maintain back precautions     Exercises      Balance Balance Balance Assessed: No   End of Session OT - End of Session Equipment Utilized During Treatment: Rolling walker;Back brace Activity Tolerance: Patient tolerated treatment well Patient left: in chair;with family/visitor present  GO     Earlie Raveling OTR/L [phone] 10/19/2012, 11:15 AM

## 2012-10-19 NOTE — Progress Notes (Signed)
   CARE MANAGEMENT NOTE 10/19/2012  Patient:  Cynthia Fitzgerald, Cynthia Fitzgerald   Account Number:  [id number]  Date Initiated:  10/17/2012  Documentation initiated by:  Jiles Crocker  Subjective/Objective Assessment:   ADMITTED FOR SURGERY - POSTERIOR LUMBAR FUSION 1 LEVEL PLIF     Action/Plan:   LIVES AT HOME WITH SPOUSE; CM FOLLOWING FOR DCP   Anticipated DC Date:  10/20/2012   Anticipated DC Plan:  HOME/SELF CARE      DC Planning Services  CM consult      [facility] Choice  HOME HEALTH   Choice offered to / List presented to:  C-1 Patient        HH arranged  HH-2 PT      HH agency  [facility]   Status of service:  Completed, signed off Medicare Important Message given?  NA - LOS <3 / Initial given by admissions (If response is "NO", the following Medicare IM given date fields will be blank) Date Medicare IM given:   Date Additional Medicare IM given:    Discharge Disposition:  HOME W HOME HEALTH SERVICES  Per UR Regulation:  Reviewed for med. necessity/level of care/duration of stay  If discussed at Long Length of Stay Meetings, dates discussed:    Comments:  10/19/2012 1730 NCM spoke to pt and offered choice for [facility]. Pt requested [facility] for [facility] PT. Referral sent to [facility]. Pt states no DME needed at home. Isidoro Donning RN CCM Case Mgmt phone [phone]  8/1/2014Abelino Derrick RN,BSN,MHA

## 2012-10-20 MED FILL — Heparin Sodium (Porcine) Inj 1000 Unit/ML: INTRAMUSCULAR | Qty: 30 | Status: AC

## 2012-10-20 MED FILL — Sodium Chloride IV Soln 0.9%: INTRAVENOUS | Qty: 1000 | Status: AC

## 2013-02-10 ENCOUNTER — Encounter: Payer: Self-pay | Admitting: Family Medicine

## 2013-12-04 ENCOUNTER — Ambulatory Visit (INDEPENDENT_AMBULATORY_CARE_PROVIDER_SITE_OTHER): Payer: BC Managed Care – PPO | Admitting: Family Medicine

## 2013-12-04 VITALS — BP 128/86 | HR 78 | Temp 98.2°F | Resp 16 | Ht 64.0 in | Wt 256.8 lb

## 2013-12-04 DIAGNOSIS — J209 Acute bronchitis, unspecified: Secondary | ICD-10-CM

## 2013-12-04 MED ORDER — AZITHROMYCIN 250 MG PO TABS: ORAL_TABLET | ORAL | Status: DC

## 2013-12-04 MED ORDER — HYDROCODONE-HOMATROPINE 5-1.5 MG/5ML PO SYRP: 5.0000 mL | ORAL_SOLUTION | Freq: Three times a day (TID) | ORAL | Status: DC | PRN

## 2013-12-04 NOTE — Progress Notes (Signed)
This chart was scribed for Robyn Haber, MD by Ladene Artist, ED Scribe. The patient was seen in room 4. Patient's care was started at 10:33 AM.  Patient ID: Cynthia Fitzgerald, female   DOB: [birth year], 58 y.o.   MRN: [id number]   Patient ID: Cynthia Fitzgerald MRN: [id number], DOB: 1956-01-22, 58 y.o. Date of Encounter: 12/04/2013, 10:33 AM  Primary Physician: Ellsworth Lennox, MD  Chief Complaint  Patient presents with   Chest Congestion    Symptoms since August 17th with no relief, productive cough with green mucus    HPI: 58 y.o. year old female with history below presents with gradually worsening productive cough with green mucus over the past month. Pt states that she was diagnosed with laryngitis a month ago that resolved but symptoms returned 1 week afterwards. She reports associated congestion, wheezing, chest tightness and weakness specifically in her throat. Pt denies SOB, vomiting, night sweats. She has tried Robitussin DM and Delsym with mild relief. No h/o asthma.   Pt also presents with a hardened lump in L palm. She describes the lump as tender.   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 Pt's husband Shanon Brow is doing well but has same symptoms.   Past Medical History  Diagnosis Date   Hyperlipidemia    Other chest pain    Headache(784.0)    Morbid obesity    Cataract      Home Meds: Prior to Admission medications   Medication Sig Start Date End Date Taking? Authorizing Provider  diazepam (VALIUM) 5 MG tablet Take 1 tablet (5 mg total) by mouth every 6 (six) hours as needed. 10/19/12   Newman Pies, MD  docusate sodium 100 MG CAPS Take 100 mg by mouth 2 (two) times daily. 10/19/12   Newman Pies, MD  HYDROcodone-acetaminophen (NORCO/VICODIN) 5-325 MG per tablet Take 1-2 tablets by mouth every 4 (four) hours as needed. 10/19/12   Newman Pies, MD  methylPREDNISolone acetate (DEPO-MEDROL) 40 MG/ML injection Inject 40 mg into the muscle every 6  (six) months. Take 3 injection every 6 month period, one injection every 2 month    Historical Provider, MD  methylPREDNISolone acetate (DEPO-MEDROL) 80 MG/ML injection Inject 80 mg into the muscle every 6 (six) months. Take 80mg  injection every 2 months    Historical Provider, MD  oxyCODONE-acetaminophen (PERCOCET/ROXICET) 5-325 MG per tablet Take 1-2 tablets by mouth every 4 (four) hours as needed. 10/19/12   Newman Pies, MD    Allergies:  Allergies  Allergen Reactions   Erythromycin Nausea Only    History   Social History   Marital Status: Married    Spouse Name: N/A    Number of Children: 1 g   Years of Education: N/A   Occupational History    [employer]   Social History Main Topics   Smoking status: Never Smoker    Smokeless tobacco: Not on file   Alcohol Use: 1.0 oz/week    2 drink(s) per week     Comment: few drinks per week   Drug Use: Not on file   Sexual Activity: Not on file   Other Topics Concern   Not on file   Social History Narrative   Lives with husband.     Review of Systems: Constitutional: negative for chills, fever, night sweats, weight changes, or fatigue  HEENT: negative for vision changes, hearing loss, rhinorrhea, ST, epistaxis, or sinus pressure, +congestion Cardiovascular: negative for chest pain or palpitations Respiratory: negative for hemoptysis, or shortness  of breath, +cough, +wheezing, +chest tightness Abdominal: negative for abdominal pain, nausea, vomiting, diarrhea, or constipation Dermatological: negative for rash Neurologic: negative for headache, dizziness, or syncope, +weakness All other systems reviewed and are otherwise negative with the exception to those above and in the HPI.  Physical Exam: Blood pressure 128/86, pulse 78, temperature 98.2 F (36.8 C), temperature source Oral, resp. rate 16, height 5\' 4"  (1.626 m), weight 256 lb 12.8 oz (116.484 kg), SpO2 95.00%., Body mass index is 44.06 kg/(m^2). General:  Well developed, well nourished, in no acute distress. Head: Normocephalic, atraumatic, eyes without discharge, sclera non-icteric, nares are without discharge. Bilateral auditory canals clear, TM's are without perforation, pearly grey and translucent with reflective cone of light bilaterally. Oral cavity moist, posterior pharynx without exudate, erythema, peritonsillar abscess, or post nasal drip.  Neck: Supple. No thyromegaly. Full ROM. No lymphadenopathy. Lungs: Expiratory wheezing with bibasilar rales.  Heart: RRR with S1 S2. No murmurs, rubs, or gallops appreciated. Abdomen: Soft, non-tender, non-distended with normoactive bowel sounds. No hepatomegaly. No rebound/guarding. No obvious abdominal masses. Msk:  Strength and tone normal for age. Extremities/Skin: Warm and dry. No clubbing or cyanosis. No edema. No rashes or suspicious lesions. Neuro: Alert and oriented X 3. Moves all extremities spontaneously. Gait is normal. CNII-XII grossly in tact. Psych:  Responds to questions appropriately with a normal affect.  Chest: Few bibasilar rales and scattered expiratory wheezes intermittently  ASSESSMENT AND PLAN:  58 y.o. year old female with  1. Acute bronchitis, unspecified organism    I personally performed the services described in this documentation, which was scribed in my presence. The recorded information has been reviewed and is accurate.  Signed, Robyn Haber, MD 12/04/2013 10:33 AM

## 2013-12-04 NOTE — Patient Instructions (Signed)

## 2014-03-23 ENCOUNTER — Ambulatory Visit (INDEPENDENT_AMBULATORY_CARE_PROVIDER_SITE_OTHER): Payer: BLUE CROSS/BLUE SHIELD

## 2014-03-23 ENCOUNTER — Ambulatory Visit (INDEPENDENT_AMBULATORY_CARE_PROVIDER_SITE_OTHER): Payer: BLUE CROSS/BLUE SHIELD | Admitting: Family Medicine

## 2014-03-23 VITALS — BP 126/92 | HR 94 | Temp 98.6°F | Resp 20 | Ht 64.0 in | Wt 259.2 lb

## 2014-03-23 DIAGNOSIS — J988 Other specified respiratory disorders: Secondary | ICD-10-CM

## 2014-03-23 DIAGNOSIS — R05 Cough: Secondary | ICD-10-CM

## 2014-03-23 DIAGNOSIS — R059 Cough, unspecified: Secondary | ICD-10-CM

## 2014-03-23 DIAGNOSIS — R0602 Shortness of breath: Secondary | ICD-10-CM

## 2014-03-23 DIAGNOSIS — J22 Unspecified acute lower respiratory infection: Secondary | ICD-10-CM

## 2014-03-23 MED ORDER — IPRATROPIUM BROMIDE 0.02 % IN SOLN
0.5000 mg | Freq: Once | RESPIRATORY_TRACT | Status: AC
  Administered 2014-03-23: 0.5 mg via RESPIRATORY_TRACT

## 2014-03-23 MED ORDER — ALBUTEROL SULFATE HFA 108 (90 BASE) MCG/ACT IN AERS: 2.0000 | INHALATION_SPRAY | Freq: Four times a day (QID) | RESPIRATORY_TRACT | Status: DC | PRN

## 2014-03-23 MED ORDER — HYDROCODONE-HOMATROPINE 5-1.5 MG/5ML PO SYRP: 5.0000 mL | ORAL_SOLUTION | Freq: Three times a day (TID) | ORAL | Status: DC | PRN

## 2014-03-23 MED ORDER — ALBUTEROL SULFATE (2.5 MG/3ML) 0.083% IN NEBU
2.5000 mg | INHALATION_SOLUTION | Freq: Once | RESPIRATORY_TRACT | Status: AC
  Administered 2014-03-23: 2.5 mg via RESPIRATORY_TRACT

## 2014-03-23 MED ORDER — AZITHROMYCIN 250 MG PO TABS: ORAL_TABLET | ORAL | Status: DC

## 2014-03-23 NOTE — Progress Notes (Signed)
Chief Complaint:  Chief Complaint  Patient presents with  . Cough    x 4 days productive/green  . Shortness of Breath  . Chest Pain    HPI: Cynthia Fitzgerald is a 59 y.o. female who is here for  4 day history of CP and also green productive cough, has ahd SOB and also chest pain, feels it deep inside. SHe has had exposure to PNA with granddaughter.  She has tried mucinex and nyquil and some hydrocodone she had left over from last bronchitis in [place]. No fevers or chills, nausea vomiting, diarrhea, rashes. She has no hx of allergies or asthma  Past Medical History  Diagnosis Date  . Hyperlipidemia   . Other chest pain   . Headache(784.0)   . Morbid obesity   . Cataract    Past Surgical History  Procedure Laterality Date  . Breast surgery      Breast reduction  . Cesarean section    . Eye surgery Right 11/13/11    cataract   History   Social History  . Marital Status: Married    Spouse Name: N/A    Number of Children: 1 g  . Years of Education: N/A   Occupational History  .  [employer]   Social History Main Topics  . Smoking status: Never Smoker   . Smokeless tobacco: None  . Alcohol Use: 1.0 oz/week    2 drink(s) per week     Comment: few drinks per week  . Drug Use: None  . Sexual Activity: None   Other Topics Concern  . None   Social History Narrative   Lives with husband.   Family History  Problem Relation Age of Onset  . Hyperlipidemia Mother   . Hypertension Father   . Diabetes Father   . Hyperlipidemia Father   . Cancer Father     prostate  . Cancer Maternal Grandmother     uterine and cervical  . Diabetes Paternal Grandmother   . Cancer Paternal Grandfather     Esophageal and lung cancer   Allergies  Allergen Reactions  . Erythromycin Nausea Only   Prior to Admission medications   Not on File     ROS: The patient denies fevers, chills, night sweats, unintentional weight loss, chest pain, palpitations, wheezing, dyspnea on  exertion, nausea, vomiting, abdominal pain, dysuria, hematuria, melena, numbness, weakness, or tingling.   All other systems have been reviewed and were otherwise negative with the exception of those mentioned in the HPI and as above.    PHYSICAL EXAM: Filed Vitals:   03/23/14 0840  BP: 126/92  Pulse: 94  Temp: 98.6 F (37 C)  Resp: 20  Spo2 95% Filed Vitals:   03/23/14 0840  Height: 5\' 4"  (1.626 m)  Weight: 259 lb 3.2 oz (117.572 kg)   Body mass index is 44.47 kg/(m^2).  General: Alert, no acute distress HEENT:  Normocephalic, atraumatic, oropharynx patent. EOMI, PERRLA Erythematous throat, no exudates, TM normal, sinus no tenderness, + erythematous/boggy nasal mucosa Cardiovascular:  Regular rate and rhythm, no rubs murmurs or gallops.  No Carotid bruits, radial pulse intact. No pedal edema.  Respiratory: Clear to auscultation bilaterally.  No wheezes, rales, or rhonchi.  No cyanosis, no use of accessory musculature GI: No organomegaly, abdomen is soft and non-tender, positive bowel sounds.  No masses. Skin: No rashes. Neurologic: Facial musculature symmetric. Psychiatric: Patient is appropriate throughout our interaction. Lymphatic: No cervical lymphadenopathy Musculoskeletal: Gait intact.   LABS:  Results for orders placed or performed during the hospital encounter of 10/07/12  Surgical pcr screen  Result Value Ref Range   MRSA, PCR NEGATIVE NEGATIVE   Staphylococcus aureus POSITIVE (A) NEGATIVE  CBC  Result Value Ref Range   WBC 10.8 (H) 4.0 - 10.5 K/uL   RBC 4.49 3.87 - 5.11 MIL/uL   Hemoglobin 13.8 12.0 - 15.0 g/dL   HCT 40.4 36.0 - 46.0 %   MCV 90.0 78.0 - 100.0 fL   MCH 30.7 26.0 - 34.0 pg   MCHC 34.2 30.0 - 36.0 g/dL   RDW 13.6 11.5 - 15.5 %   Platelets 224 150 - 400 K/uL  Basic metabolic panel  Result Value Ref Range   Sodium 136 135 - 145 mEq/L   Potassium 3.7 3.5 - 5.1 mEq/L   Chloride 101 96 - 112 mEq/L   CO2 22 19 - 32 mEq/L   Glucose, Bld 95 70 -  99 mg/dL   BUN 15 6 - 23 mg/dL   Creatinine, Ser 0.58 0.50 - 1.10 mg/dL   Calcium 9.5 8.4 - 10.5 mg/dL   GFR calc non Af Amer >90 >90 mL/min   GFR calc Af Amer >90 >90 mL/min  Type and screen  Result Value Ref Range   ABO/RH(D) A POS    Antibody Screen NEG    Sample Expiration 10/21/2012   ABO/Rh  Result Value Ref Range   ABO/RH(D) A POS      EKG/XRAY:   Primary read interpreted by Dr. Marin Comment at [facility] ? Right middle lobe hazy infiltrate vs increase vascular markings   ASSESSMENT/PLAN: Encounter Diagnoses  Name Primary?  . Cough   . SOB (shortness of breath)   . Lower respiratory infection (e.g., bronchitis, pneumonia, pneumonitis, pulmonitis) Yes   59 y/o female with LRI, bronchitis vs PNA Will treat with z pack Rx albuterol and also hycodan and otc mucinex prn F/u prn  Gross sideeffects, risk and benefits, and alternatives of medications d/w patient. Patient is aware that all medications have potential sideeffects and we are unable to predict every sideeffect or drug-drug interaction that may occur.  Cynthia Fitzgerald, [place], DO 03/23/2014 9:34 AM

## 2014-03-23 NOTE — Patient Instructions (Signed)
Acute Bronchitis °Bronchitis is inflammation of the airways that extend from the windpipe into the lungs (bronchi). The inflammation often causes mucus to develop. This leads to a cough, which is the most common symptom of bronchitis.  °In acute bronchitis, the condition usually develops suddenly and goes away over time, usually in a couple weeks. Smoking, allergies, and asthma can make bronchitis worse. Repeated episodes of bronchitis may cause further lung problems.  °CAUSES °Acute bronchitis is most often caused by the same virus that causes a cold. The virus can spread from person to person (contagious) through coughing, sneezing, and touching contaminated objects. °SIGNS AND SYMPTOMS  °· Cough.   °· Fever.   °· Coughing up mucus.   °· Body aches.   °· Chest congestion.   °· Chills.   °· Shortness of breath.   °· Sore throat.   °DIAGNOSIS  °Acute bronchitis is usually diagnosed through a physical exam. Your health care provider will also ask you questions about your medical history. Tests, such as chest X-rays, are sometimes done to rule out other conditions.  °TREATMENT  °Acute bronchitis usually goes away in a couple weeks. Oftentimes, no medical treatment is necessary. Medicines are sometimes given for relief of fever or cough. Antibiotic medicines are usually not needed but may be prescribed in certain situations. In some cases, an inhaler may be recommended to help reduce shortness of breath and control the cough. A cool mist vaporizer may also be used to help thin bronchial secretions and make it easier to clear the chest.  °HOME CARE INSTRUCTIONS °· Get plenty of rest.   °· Drink enough fluids to keep your urine clear or pale yellow (unless you have a medical condition that requires fluid restriction). Increasing fluids may help thin your respiratory secretions (sputum) and reduce chest congestion, and it will prevent dehydration.   °· Take medicines only as directed by your health care provider. °· If  you were prescribed an antibiotic medicine, finish it all even if you start to feel better. °· Avoid smoking and secondhand smoke. Exposure to cigarette smoke or irritating chemicals will make bronchitis worse. If you are a smoker, consider using nicotine gum or skin patches to help control withdrawal symptoms. Quitting smoking will help your lungs heal faster.   °· Reduce the chances of another bout of acute bronchitis by washing your hands frequently, avoiding people with cold symptoms, and trying not to touch your hands to your mouth, nose, or eyes.   °· Keep all follow-up visits as directed by your health care provider.   °SEEK MEDICAL CARE IF: °Your symptoms do not improve after 1 week of treatment.  °SEEK IMMEDIATE MEDICAL CARE IF: °· You develop an increased fever or chills.   °· You have chest pain.   °· You have severe shortness of breath. °· You have bloody sputum.   °· You develop dehydration. °· You faint or repeatedly feel like you are going to pass out. °· You develop repeated vomiting. °· You develop a severe headache. °MAKE SURE YOU:  °· Understand these instructions. °· Will watch your condition. °· Will get help right away if you are not doing well or get worse. °Document Released: 04/12/2004 Document Revised: 07/20/2013 Document Reviewed: 08/26/2012 °ExitCare® Patient Information ©2015 [employer]. This information is not intended to replace advice given to you by your health care provider. Make sure you discuss any questions you have with your health care provider. ° °

## 2014-03-29 ENCOUNTER — Encounter: Payer: Self-pay | Admitting: Family Medicine

## 2014-07-29 ENCOUNTER — Encounter: Payer: Self-pay | Admitting: Family Medicine

## 2014-07-29 ENCOUNTER — Ambulatory Visit (INDEPENDENT_AMBULATORY_CARE_PROVIDER_SITE_OTHER): Payer: BLUE CROSS/BLUE SHIELD | Admitting: Family Medicine

## 2014-07-29 VITALS — BP 130/92 | HR 78 | Temp 97.7°F | Resp 16 | Ht 64.0 in | Wt 256.0 lb

## 2014-07-29 DIAGNOSIS — Z01419 Encounter for gynecological examination (general) (routine) without abnormal findings: Secondary | ICD-10-CM

## 2014-07-29 DIAGNOSIS — Z124 Encounter for screening for malignant neoplasm of cervix: Secondary | ICD-10-CM | POA: Diagnosis not present

## 2014-07-29 DIAGNOSIS — Z0000 Encounter for general adult medical examination without abnormal findings: Secondary | ICD-10-CM

## 2014-07-29 DIAGNOSIS — Z131 Encounter for screening for diabetes mellitus: Secondary | ICD-10-CM | POA: Diagnosis not present

## 2014-07-29 DIAGNOSIS — Z13220 Encounter for screening for lipoid disorders: Secondary | ICD-10-CM

## 2014-07-29 DIAGNOSIS — Z130 Encounter for screening for diseases of the blood and blood-forming organs and certain disorders involving the immune mechanism: Secondary | ICD-10-CM | POA: Diagnosis not present

## 2014-07-29 DIAGNOSIS — E559 Vitamin D deficiency, unspecified: Secondary | ICD-10-CM

## 2014-07-29 DIAGNOSIS — R0683 Snoring: Secondary | ICD-10-CM | POA: Diagnosis not present

## 2014-07-29 DIAGNOSIS — Z1159 Encounter for screening for other viral diseases: Secondary | ICD-10-CM

## 2014-07-29 DIAGNOSIS — G479 Sleep disorder, unspecified: Secondary | ICD-10-CM | POA: Diagnosis not present

## 2014-07-29 LAB — POCT URINALYSIS DIPSTICK
Bilirubin, UA: NEGATIVE
Blood, UA: NEGATIVE
GLUCOSE UA: NEGATIVE
KETONES UA: NEGATIVE
Nitrite, UA: NEGATIVE
Protein, UA: NEGATIVE
Spec Grav, UA: 1.025
Urobilinogen, UA: 0.2
pH, UA: 5.5

## 2014-07-29 LAB — CBC WITH DIFFERENTIAL/PLATELET
Basophils Absolute: 0 10*3/uL (ref 0.0–0.1)
Basophils Relative: 0 % (ref 0–1)
Eosinophils Absolute: 0.1 10*3/uL (ref 0.0–0.7)
Eosinophils Relative: 1 % (ref 0–5)
HCT: 42.2 % (ref 36.0–46.0)
Hemoglobin: 14.4 g/dL (ref 12.0–15.0)
Lymphocytes Relative: 38 % (ref 12–46)
Lymphs Abs: 3.4 10*3/uL (ref 0.7–4.0)
MCH: 29.5 pg (ref 26.0–34.0)
MCHC: 34.1 g/dL (ref 30.0–36.0)
MCV: 86.5 fL (ref 78.0–100.0)
MONOS PCT: 4 % (ref 3–12)
MPV: 10 fL (ref 8.6–12.4)
Monocytes Absolute: 0.4 10*3/uL (ref 0.1–1.0)
NEUTROS ABS: 5.1 10*3/uL (ref 1.7–7.7)
Neutrophils Relative %: 57 % (ref 43–77)
Platelets: 247 10*3/uL (ref 150–400)
RBC: 4.88 MIL/uL (ref 3.87–5.11)
RDW: 14.3 % (ref 11.5–15.5)
WBC: 9 10*3/uL (ref 4.0–10.5)

## 2014-07-29 LAB — COMPREHENSIVE METABOLIC PANEL
ALT: 25 U/L (ref 0–35)
AST: 24 U/L (ref 0–37)
Albumin: 4.3 g/dL (ref 3.5–5.2)
Alkaline Phosphatase: 86 U/L (ref 39–117)
BILIRUBIN TOTAL: 0.6 mg/dL (ref 0.2–1.2)
BUN: 16 mg/dL (ref 6–23)
CO2: 26 mEq/L (ref 19–32)
Calcium: 9.4 mg/dL (ref 8.4–10.5)
Chloride: 104 mEq/L (ref 96–112)
Creat: 0.65 mg/dL (ref 0.50–1.10)
GLUCOSE: 103 mg/dL — AB (ref 70–99)
POTASSIUM: 4.6 meq/L (ref 3.5–5.3)
Sodium: 139 mEq/L (ref 135–145)
TOTAL PROTEIN: 7.4 g/dL (ref 6.0–8.3)

## 2014-07-29 LAB — LIPID PANEL
CHOL/HDL RATIO: 5.3 ratio
CHOLESTEROL: 262 mg/dL — AB (ref 0–200)
HDL: 49 mg/dL (ref >=46)
LDL Cholesterol: 179 mg/dL — ABNORMAL HIGH (ref 0–99)
Triglycerides: 169 mg/dL — ABNORMAL HIGH (ref <150)
VLDL: 34 mg/dL (ref 0–40)

## 2014-07-29 LAB — VITAMIN D 25 HYDROXY (VIT D DEFICIENCY, FRACTURES): Vit D, 25-Hydroxy: 18 ng/mL — ABNORMAL LOW (ref 30–100)

## 2014-07-29 LAB — HEMOGLOBIN A1C
Hgb A1c MFr Bld: 6.2 % — ABNORMAL HIGH (ref <5.7)
MEAN PLASMA GLUCOSE: 131 mg/dL — AB (ref <117)

## 2014-07-29 LAB — HEPATITIS C ANTIBODY: HCV Ab: NEGATIVE

## 2014-07-29 MED ORDER — ZOSTER VACCINE LIVE 19400 UNT/0.65ML ~~LOC~~ SOLR: 0.6500 mL | Freq: Once | SUBCUTANEOUS | Status: DC

## 2014-07-29 NOTE — Patient Instructions (Signed)
Keeping You Healthy  Get These Tests  Blood Pressure- Have your blood pressure checked by your healthcare provider at least once a year.  Normal blood pressure is 120/80.  Weight- Have your body mass index (BMI) calculated to screen for obesity.  BMI is a measure of body fat based on height and weight.  You can calculate your own BMI at www.nhlbisupport.com/bmi/  Cholesterol- Have your cholesterol checked every year.  Diabetes- Have your blood sugar checked every year if you have high blood pressure, high cholesterol, a family history of diabetes or if you are overweight.  Pap Smear- Have a pap smear every 1 to 3 years if you have been sexually active.  If you are older than 65 and recent pap smears have been normal you may not need additional pap smears.  In addition, if you have had a hysterectomy  For benign disease additional pap smears are not necessary.  Mammogram-Yearly mammograms are essential for early detection of breast cancer  Screening for Colon Cancer- Colonoscopy starting at age 50. Screening may begin sooner depending on your family history and other health conditions.  Follow up colonoscopy as directed by your Gastroenterologist.  Screening for Osteoporosis- Screening begins at age 65 with bone density scanning, sooner if you are at higher risk for developing Osteoporosis.  Get these medicines  Calcium with Vitamin D- Your body requires 1200-1500 mg of Calcium a day and 800-1000 IU of Vitamin D a day.  You can only absorb 500 mg of Calcium at a time therefore Calcium must be taken in 2 or 3 separate doses throughout the day.  Hormones- Hormone therapy has been associated with increased risk for certain cancers and heart disease.  Talk to your healthcare provider about if you need relief from menopausal symptoms.  Aspirin- Ask your healthcare provider about taking Aspirin to prevent Heart Disease and Stroke.  Get these Immuniztions  Flu shot- Every fall  Pneumonia  shot- Once after the age of 65; if you are younger ask your healthcare provider if you need a pneumonia shot.  Tetanus- Every ten years.  Zostavax- Once after the age of 60 to prevent shingles.  Take these steps  Don't smoke- Your healthcare provider can help you quit. For tips on how to quit, ask your healthcare provider or go to www.smokefree.gov or call 1-800 QUIT-NOW.  Be physically active- Exercise 5 days a week for a minimum of 30 minutes.  If you are not already physically active, start slow and gradually work up to 30 minutes of moderate physical activity.  Try walking, dancing, bike riding, swimming, etc.  Eat a healthy diet- Eat a variety of healthy foods such as fruits, vegetables, whole grains, low fat milk, low fat cheeses, yogurt, lean meats, chicken, fish, eggs, dried beans, tofu, etc.  For more information go to www.thenutritionsource.org  Dental visit- Brush and floss teeth twice daily; visit your dentist twice a year.  Eye exam- Visit your Optometrist or Ophthalmologist yearly.  Drink alcohol in moderation- Limit alcohol intake to one drink or less a day.  Never drink and drive.  Depression- Your emotional health is as important as your physical health.  If you're feeling down or losing interest in things you normally enjoy, please talk to your healthcare provider.  Seat Belts- can save your life; always wear one  Smoke/Carbon Monoxide detectors- These detectors need to be installed on the appropriate level of your home.  Replace batteries at least once a year.  Violence- If anyone   is threatening or hurting you, please tell your healthcare provider.  Living Will/ Health care power of attorney- Discuss with your healthcare provider and family.   You should call back at the end of the summer to schedule a follow-up with [facility]  or [facility]; these are our veteran PA-Cs. I do not know what there schedules are but I want you to have your BP monitored as well as your weight.  If any of the labs are abnormal, these need to be monitored as well. Good luck with your weight loss efforts.

## 2014-07-30 LAB — PAP IG (IMAGE GUIDED)

## 2014-08-01 ENCOUNTER — Other Ambulatory Visit: Payer: Self-pay | Admitting: Family Medicine

## 2014-08-01 MED ORDER — GEMFIBROZIL 600 MG PO TABS: 600.0000 mg | ORAL_TABLET | Freq: Two times a day (BID) | ORAL | Status: DC

## 2014-08-01 MED ORDER — ERGOCALCIFEROL 1.25 MG (50000 UT) PO CAPS: 50000.0000 [IU] | ORAL_CAPSULE | ORAL | Status: DC

## 2014-08-01 NOTE — Progress Notes (Signed)
Subjective:    Patient ID: Cynthia Fitzgerald, female    DOB: June 30, 1955, 59 y.o.   MRN: [id number]  HPI  This 59 y.o female is here for CPE/PAP, last seen by me in August 2013. Pt's work has kept her very busy, such that she has no time for healthy lifestyle changes (exercise, improved nutrition, etc). She is considering weight loss program with a local clinic, recognizing that she needs to make a drastic change. She is having some minor physical problems as a results of her weight; she has been told she snores and she has daytime fatigue.  HCM: PAP- 2013 (negative).           MMG- Oct 2015 (negative).           CRS- 2006? by Dr. Collene Mares; due this year.           IMM- Current. Zostavax due this year.           Vision- Current.           Dental- Current.   Patient Active Problem List   Diagnosis Date Noted  . Chest pain, atypical 10/22/2011  . Hyperlipidemia 10/22/2011  . Headache disorder 10/22/2011  . Obesity, Class III, BMI 40-49.9 (morbid obesity) 10/22/2011  . Family history of breast cancer in first degree relative 10/22/2011  . Family history of uterine cancer 10/22/2011  . Family history of prostate cancer 10/22/2011  . Family history of Alzheimer's disease 10/22/2011    Prior to Admission medications   Medication Sig Start Date End Date Taking? Authorizing Provider  IBUPROFEN PO Take by mouth.   Yes Historical Provider, MD  Multiple Vitamin (MULTIVITAMIN) tablet Take 1 tablet by mouth daily.   Yes Historical Provider, MD  albuterol (PROVENTIL HFA;VENTOLIN HFA) 108 (90 BASE) MCG/ACT inhaler Inhale 2 puffs into the lungs every 6 (six) hours as needed for wheezing or shortness of breath. Patient not taking: Reported on 07/29/2014 03/23/14   Glenford Bayley, DO    Past Surgical History  Procedure Laterality Date  . Breast surgery      Breast reduction  . Cesarean section    . Eye surgery Right 11/13/11    cataract    History   Social History  . Marital Status: Married    Spouse  Name: N/A  . Number of Children: 1 g  . Years of Education: N/A   Occupational History  .  [employer]   Social History Main Topics  . Smoking status: Never Smoker   . Smokeless tobacco: Not on file  . Alcohol Use: 1.0 oz/week    2 drink(s) per week     Comment: few drinks per week  . Drug Use: Not on file  . Sexual Activity: Not on file   Other Topics Concern  . Not on file   Social History Narrative   Lives with husband.    Family History  Problem Relation Age of Onset  . Hyperlipidemia Mother   . Hypertension Father   . Diabetes Father   . Hyperlipidemia Father   . Cancer Father     prostate  . Cancer Maternal Grandmother     uterine and cervical  . Diabetes Paternal Grandmother   . Cancer Paternal Grandfather     Esophageal and lung cancer    Review of Systems  Constitutional: Positive for fatigue.  HENT: Negative.   Eyes: Negative.   Respiratory: Positive for apnea.        Snores.  Cardiovascular:  Negative.   Gastrointestinal: Negative.   Endocrine: Negative.   Genitourinary: Negative.   Musculoskeletal: Positive for arthralgias. Negative for myalgias, back pain and joint swelling.  Skin: Negative.   Neurological: Negative.   Hematological: Negative.   Psychiatric/Behavioral: Positive for sleep disturbance.       Objective:   Physical Exam  Constitutional: She is oriented to person, place, and time. Vital signs are normal. She appears well-developed and well-nourished. No distress.  Blood pressure 130/92, pulse 78, temperature 97.7 F (36.5 C), temperature source Oral, resp. rate 16, height 5\' 4"  (1.626 m), weight 256 lb (116.121 kg), SpO2 98 %.   HENT:  Head: Normocephalic and atraumatic.  Right Ear: Hearing, tympanic membrane, external ear and ear canal normal.  Left Ear: Hearing, tympanic membrane, external ear and ear canal normal.  Nose: Nose normal. No nasal deformity or septal deviation.  Mouth/Throat: Uvula is midline and mucous  membranes are normal. No oral lesions. Normal dentition. No dental caries. Posterior oropharyngeal erythema present.  Posterior pharynx w/ excess adenoid tissue.  Eyes: Conjunctivae, EOM and lids are normal. Pupils are equal, round, and reactive to light. No scleral icterus.  S/P cataract surgery; intra-ocular lenses in place.  Neck: Trachea normal, normal range of motion, full passive range of motion without pain and phonation normal. Neck supple. No JVD present. No spinous process tenderness and no muscular tenderness present. No thyroid mass and no thyromegaly present.  Cardiovascular: Normal rate, regular rhythm, S1 normal, S2 normal, intact distal pulses and normal pulses.   No extrasystoles are present. PMI is not displaced.  Exam reveals distant heart sounds. Exam reveals no gallop.   No murmur heard. Pulmonary/Chest: Effort normal and breath sounds normal. No respiratory distress. She has no decreased breath sounds. She has no wheezes. She has no rhonchi. Right breast exhibits no inverted nipple, no mass, no nipple discharge, no skin change and no tenderness. Left breast exhibits no inverted nipple, no mass, no nipple discharge, no skin change and no tenderness. Breasts are symmetrical.  Abdominal: Soft. Normal aorta and bowel sounds are normal. She exhibits no distension and no mass. There is no hepatosplenomegaly. There is no tenderness. There is no guarding and no CVA tenderness.  Abd adipose tissue makes palpation of organs or masses difficult.  Genitourinary: Vagina normal and uterus normal. There is no rash, tenderness or lesion on the right labia. There is no rash, tenderness or lesion on the left labia. Cervix exhibits discharge. Cervix exhibits no motion tenderness and no friability. Right adnexum displays no mass, no tenderness and no fullness. Left adnexum displays no mass, no tenderness and no fullness. No erythema, tenderness or bleeding in the vagina. No signs of injury around the  vagina. No vaginal discharge found.  Difficult to palpate pelvic organs due to  Adiposity.  Musculoskeletal:       Cervical back: Normal.       Thoracic back: Normal.       Lumbar back: She exhibits tenderness and spasm. She exhibits no bony tenderness, no swelling, no deformity and no pain.  Remainder of exam unremarkable.   Lymphadenopathy:       Head (right side): No submental, no submandibular, no tonsillar, no preauricular, no posterior auricular and no occipital adenopathy present.       Head (left side): No submental, no submandibular, no tonsillar, no preauricular, no posterior auricular and no occipital adenopathy present.    She has no cervical adenopathy.    She has no axillary adenopathy.  Right: No inguinal and no supraclavicular adenopathy present.       Left: No inguinal and no supraclavicular adenopathy present.  Neurological: She is alert and oriented to person, place, and time. She has normal strength. She displays no atrophy. No cranial nerve deficit or sensory deficit. She exhibits normal muscle tone. Coordination and gait normal. She displays no Babinski's sign on the right side. She displays no Babinski's sign on the left side.  Reflex Scores:      Tricep reflexes are 1+ on the right side and 1+ on the left side.      Bicep reflexes are 2+ on the right side and 2+ on the left side.      Brachioradialis reflexes are 1+ on the right side and 1+ on the left side.      Patellar reflexes are 2+ on the right side and 2+ on the left side. Skin: Skin is warm, dry and intact. No ecchymosis, no lesion and no rash noted. She is not diaphoretic. No cyanosis or erythema. No pallor. Nails show no clubbing.  Psychiatric: She has a normal mood and affect. Her speech is normal and behavior is normal. Judgment and thought content normal. Cognition and memory are normal.  Nursing note and vitals reviewed.     Assessment & Plan:  Encounter for health maintenance examination - Plan:  POCT urinalysis dipstick  Encounter for cervical Pap smear with pelvic exam - Plan: Pap IG (Image Guided)  Vitamin D deficiency - Previous level = 23; pt not taking a supplement.Plan: Vitamin D, 25-hydroxy  Snoring - Plan: Ambulatory referral to Neurology, Split night study  Sleep disturbance - Plan: Ambulatory referral to Neurology, Split night study  Screening for diabetes mellitus - Plan: Comprehensive metabolic panel, Hemoglobin A1c  Screening for lipid disorders - Plan: Lipid panel  Screening for deficiency anemia - Plan: CBC with Differential/Platelet  Need for hepatitis C screening test - Plan: Hepatitis C antibody  Meds ordered this encounter  Medications  . zoster vaccine live, PF, (ZOSTAVAX) 02725 UNT/0.65ML injection    Sig: Inject 19,400 Units into the skin once.    Dispense:  1 each    Refill:  0

## 2014-08-04 ENCOUNTER — Other Ambulatory Visit: Payer: Self-pay

## 2014-08-04 DIAGNOSIS — R0683 Snoring: Secondary | ICD-10-CM

## 2014-08-04 DIAGNOSIS — G479 Sleep disorder, unspecified: Secondary | ICD-10-CM

## 2015-01-17 LAB — HM MAMMOGRAPHY

## 2015-01-19 ENCOUNTER — Encounter: Payer: Self-pay | Admitting: Family Medicine

## 2015-04-25 ENCOUNTER — Ambulatory Visit (INDEPENDENT_AMBULATORY_CARE_PROVIDER_SITE_OTHER): Payer: BLUE CROSS/BLUE SHIELD | Admitting: Urgent Care

## 2015-04-25 ENCOUNTER — Encounter: Payer: Self-pay | Admitting: Urgent Care

## 2015-04-25 VITALS — BP 118/82 | HR 77 | Temp 98.3°F | Resp 18 | Ht 64.75 in | Wt 259.0 lb

## 2015-04-25 DIAGNOSIS — R0789 Other chest pain: Secondary | ICD-10-CM

## 2015-04-25 DIAGNOSIS — E785 Hyperlipidemia, unspecified: Secondary | ICD-10-CM | POA: Diagnosis not present

## 2015-04-25 LAB — COMPREHENSIVE METABOLIC PANEL
ALT: 21 U/L (ref 6–29)
AST: 20 U/L (ref 10–35)
Albumin: 4 g/dL (ref 3.6–5.1)
Alkaline Phosphatase: 79 U/L (ref 33–130)
BUN: 20 mg/dL (ref 7–25)
CHLORIDE: 103 mmol/L (ref 98–110)
CO2: 29 mmol/L (ref 20–31)
Calcium: 9.4 mg/dL (ref 8.6–10.4)
Creat: 0.72 mg/dL (ref 0.50–0.99)
Glucose, Bld: 108 mg/dL — ABNORMAL HIGH (ref 65–99)
POTASSIUM: 4.2 mmol/L (ref 3.5–5.3)
Sodium: 142 mmol/L (ref 135–146)
TOTAL PROTEIN: 7.1 g/dL (ref 6.1–8.1)
Total Bilirubin: 0.6 mg/dL (ref 0.2–1.2)

## 2015-04-25 LAB — LIPID PANEL
Cholesterol: 233 mg/dL — ABNORMAL HIGH (ref 125–200)
HDL: 42 mg/dL — AB (ref >=46)
LDL CALC: 144 mg/dL — AB (ref <130)
TRIGLYCERIDES: 234 mg/dL — AB (ref <150)
Total CHOL/HDL Ratio: 5.5 Ratio — ABNORMAL HIGH (ref <=5.0)
VLDL: 47 mg/dL — ABNORMAL HIGH (ref <30)

## 2015-04-25 LAB — POCT CBC
Granulocyte percent: 58.6 %G (ref 37–80)
HCT, POC: 42.5 % (ref 37.7–47.9)
Hemoglobin: 14.4 g/dL (ref 12.2–16.2)
Lymph, poc: 3.4 (ref 0.6–3.4)
MCH: 29.6 pg (ref 27–31.2)
MCHC: 34 g/dL (ref 31.8–35.4)
MCV: 87.1 fL (ref 80–97)
MID (CBC): 0.3 (ref 0–0.9)
MPV: 7.9 fL (ref 0–99.8)
POC GRANULOCYTE: 5.2 (ref 2–6.9)
POC LYMPH PERCENT: 38.3 %L (ref 10–50)
POC MID %: 3.1 % (ref 0–12)
Platelet Count, POC: 210 10*3/uL (ref 142–424)
RBC: 4.88 M/uL (ref 4.04–5.48)
RDW, POC: 14.1 %
WBC: 8.8 10*3/uL (ref 4.6–10.2)

## 2015-04-25 MED ORDER — GI COCKTAIL ~~LOC~~
30.0000 mL | Freq: Once | ORAL | Status: AC
  Administered 2015-04-25: 30 mL via ORAL

## 2015-04-25 NOTE — Patient Instructions (Addendum)
Nonspecific Chest Pain   Chest pain can be caused by many different conditions. There is always a chance that your pain could be related to something serious, such as a heart attack or a blood clot in your lungs. Chest pain can also be caused by conditions that are not life-threatening. If you have chest pain, it is very important to follow up with your health care provider.  CAUSES   Chest pain can be caused by:   Heartburn.   Pneumonia or bronchitis.   Anxiety or stress.   Inflammation around your heart (pericarditis) or lung (pleuritis or pleurisy).   A blood clot in your lung.   A collapsed lung (pneumothorax). It can develop suddenly on its own (spontaneous pneumothorax) or from trauma to the chest.   Shingles infection (varicella-zoster virus).   Heart attack.   Damage to the bones, muscles, and cartilage that make up your chest wall. This can include:    Bruised bones due to injury.    Strained muscles or cartilage due to frequent or repeated coughing or overwork.    Fracture to one or more ribs.    Sore cartilage due to inflammation (costochondritis).  RISK FACTORS   Risk factors for chest pain may include:   Activities that increase your risk for trauma or injury to your chest.   Respiratory infections or conditions that cause frequent coughing.   Medical conditions or overeating that can cause heartburn.   Heart disease or family history of heart disease.   Conditions or health behaviors that increase your risk of developing a blood clot.   Having had chicken pox (varicella zoster).  SIGNS AND SYMPTOMS  Chest pain can feel like:   Burning or tingling on the surface of your chest or deep in your chest.   Crushing, pressure, aching, or squeezing pain.   Dull or sharp pain that is worse when you move, cough, or take a deep breath.   Pain that is also felt in your back, neck, shoulder, or arm, or pain that spreads to any of these areas.  Your chest pain may come and go, or it may stay  constant.  DIAGNOSIS  Lab tests or other studies may be needed to find the cause of your pain. Your health care provider may have you take a test called an ambulatory ECG (electrocardiogram). An ECG records your heartbeat patterns at the time the test is performed. You may also have other tests, such as:   Transthoracic echocardiogram (TTE). During echocardiography, sound waves are used to create a picture of all of the heart structures and to look at how blood flows through your heart.   Transesophageal echocardiogram (TEE).This is a more advanced imaging test that obtains images from inside your body. It allows your health care provider to see your heart in finer detail.   Cardiac monitoring. This allows your health care provider to monitor your heart rate and rhythm in real time.   Holter monitor. This is a portable device that records your heartbeat and can help to diagnose abnormal heartbeats. It allows your health care provider to track your heart activity for several days, if needed.   Stress tests. These can be done through exercise or by taking medicine that makes your heart beat more quickly.   Blood tests.   Imaging tests.  TREATMENT   Your treatment depends on what is causing your chest pain. Treatment may include:   Medicines. These may include:    Acid blockers for   or cold packs to injured areas.  Limiting activities until pain decreases. HOME CARE INSTRUCTIONS  If you were prescribed an antibiotic medicine, finish it all even if you start to feel better.  Avoid any activities that bring on chest pain.  Do not use any tobacco products, including  cigarettes, chewing tobacco, or electronic cigarettes. If you need help quitting, ask your health care provider.  Do not drink alcohol.  Take medicines only as directed by your health care provider.  Keep all follow-up visits as directed by your health care provider. This is important. This includes any further testing if your chest pain does not go away.  If heartburn is the cause for your chest pain, you may be told to keep your head raised (elevated) while sleeping. This reduces the chance that acid will go from your stomach into your esophagus.  Make lifestyle changes as directed by your health care provider. These may include:  Getting regular exercise. Ask your health care provider to suggest some activities that are safe for you.  Eating a heart-healthy diet. A registered dietitian can help you to learn healthy eating options.  Maintaining a healthy weight.  Managing diabetes, if necessary.  Reducing stress. SEEK MEDICAL CARE IF:  Your chest pain does not go away after treatment.  You have a rash with blisters on your chest.  You have a fever. SEEK IMMEDIATE MEDICAL CARE IF:   Your chest pain is worse.  You have an increasing cough, or you cough up blood.  You have severe abdominal pain.  You have severe weakness.  You faint.  You have chills.  You have sudden, unexplained chest discomfort.  You have sudden, unexplained discomfort in your arms, back, neck, or jaw.  You have shortness of breath at any time.  You suddenly start to sweat, or your skin gets clammy.  You feel nauseous or you vomit.  You suddenly feel light-headed or dizzy.  Your heart begins to beat quickly, or it feels like it is skipping beats. These symptoms may represent a serious problem that is an emergency. Do not wait to see if the symptoms will go away. Get medical help right away. Call your local emergency services (911 in the [place] Do not drive yourself to the hospital.   This  information is not intended to replace advice given to you by your health care provider. Make sure you discuss any questions you have with your health care provider.   Document Released: 12/13/2004 Document Revised: 03/26/2014 Document Reviewed: 10/09/2013 Elsevier Interactive Patient Education 2016 Elsevier Inc.    Cholesterol Cholesterol is a white, waxy, fat-like substance needed by your body in small amounts. The liver makes all the cholesterol you need. Cholesterol is carried from the liver by the blood through the blood vessels. Deposits of cholesterol (plaque) may build up on blood vessel walls. These make the arteries narrower and stiffer. Cholesterol plaques increase the risk for heart attack and stroke.  You cannot feel your cholesterol level even if it is very high. The only way to know it is high is with a blood test. Once you know your cholesterol levels, you should keep a record of the test results. Work with your health care provider to keep your levels in the desired range.  WHAT DO THE RESULTS MEAN?  Total cholesterol is a rough measure of all the cholesterol in your blood.   LDL is the so-called bad cholesterol. This is the type that deposits cholesterol in the walls  of the arteries. You want this level to be low.   HDL is the good cholesterol because it cleans the arteries and carries the LDL away. You want this level to be high.  Triglycerides are fat that the body can either burn for energy or store. High levels are closely linked to heart disease.  WHAT ARE THE DESIRED LEVELS OF CHOLESTEROL?  Total cholesterol below 200.   LDL below 100 for people at risk, below 70 for those at very high risk.   HDL above 50 is good, above 60 is best.   Triglycerides below 150.  HOW CAN I LOWER MY CHOLESTEROL?  Diet. Follow your diet programs as directed by your health care provider.   Choose fish or white meat chicken and [place], roasted or baked. Limit fatty cuts of  red meat, fried foods, and processed meats, such as sausage and lunch meats.   Eat lots of fresh fruits and vegetables.  Choose whole grains, beans, pasta, potatoes, and cereals.   Use only small amounts of olive, corn, or canola oils.   Avoid butter, mayonnaise, shortening, or palm kernel oils.  Avoid foods with trans fats.   Drink skim or nonfat milk and eat low-fat or nonfat yogurt and cheeses. Avoid whole milk, cream, ice cream, egg yolks, and full-fat cheeses.   Healthy desserts include angel food cake, ginger snaps, animal crackers, hard candy, popsicles, and low-fat or nonfat frozen yogurt. Avoid pastries, cakes, pies, and cookies.   Exercise. Follow your exercise programs as directed by your health care provider.   A regular program helps decrease LDL and raise HDL.   A regular program helps with weight control.   Do things that increase your activity level like gardening, walking, or taking the stairs. Ask your health care provider about how you can be more active in your daily life.   Medicine. Take medicine only as directed by your health care provider.   Medicine may be prescribed by your health care provider to help lower cholesterol and decrease the risk for heart disease.   If you have several risk factors, you may need medicine even if your levels are normal.   This information is not intended to replace advice given to you by your health care provider. Make sure you discuss any questions you have with your health care provider.   Document Released: 11/28/2000 Document Revised: 03/26/2014 Document Reviewed: 12/17/2012 Elsevier Interactive Patient Education [employer].

## 2015-04-25 NOTE — Progress Notes (Signed)
MRN: [id number] DOB: [birth year]  Subjective:   Cynthia Fitzgerald is a 60 y.o. female presenting for chief complaint of Heartburn and Heart Racing  Reports ~2 week history of daily heartburn, mid-sternal chest discomfort, worsened by eating. Also reports heart racing intermittently at night, wakes her up out of bed feels pounding heart sensation, has counted her heart rate at about 90-100. She has checked her BP a few times and has been [id number] systolic with a wrist cuff. Of note, patient has had a cardiac work up in 2013, has a history of hyperlipidemia. Denies chest pain, n/v, abdominal pain, diaphoresis, radiation of chest discomfort to neck, jaw or limb. Denies smoking cigarettes, occasional drink of alcohol.   Cynthia Fitzgerald has a current medication list which includes the following prescription(s): ibuprofen and multivitamin. Also is allergic to erythromycin.  Cynthia Fitzgerald  has a past medical history of Hyperlipidemia; Other chest pain; Headache(784.0); Morbid obesity ([place]); and Cataract. Also  has past surgical history that includes Breast surgery; Cesarean section; and Eye surgery (Right, 11/13/11).  Her family history includes Cancer in her father, maternal grandmother, and paternal grandfather; Diabetes in her father and paternal grandmother; Hyperlipidemia in her father and mother; Hypertension in her father.   Objective:   Vitals: BP 118/82 mmHg  Pulse 77  Temp(Src) 98.3 F (36.8 C) (Oral)  Resp 18  Ht 5' 4.75" (1.645 m)  Wt 259 lb (117.482 kg)  BMI 43.41 kg/m2  SpO2 98%  Physical Exam  Constitutional: She is oriented to person, place, and time. She appears well-developed and well-nourished.  HENT:  Mouth/Throat: Oropharynx is clear and moist.  Eyes: No scleral icterus.  Cardiovascular: Normal rate, regular rhythm and intact distal pulses.  Exam reveals no gallop and no friction rub.   No murmur heard. Pulmonary/Chest: No respiratory distress. She has no wheezes. She has no rales.    Abdominal: Soft. Bowel sounds are normal. She exhibits no distension and no mass. There is no tenderness.  Neurological: She is alert and oriented to person, place, and time.  Skin: Skin is warm and dry.   ECG interpretation - No evidence of acute process. Q-wave in Lead III (not new), poor R-wave progression, non-specific changes in V4, V5, comparison to ECG from 2013.  Results for orders placed or performed in visit on 04/25/15 (from the past 24 hour(s))  POCT CBC     Status: None   Collection Time: 04/25/15  9:25 AM  Result Value Ref Range   WBC 8.8 4.6 - 10.2 K/uL   Lymph, poc 3.4 0.6 - 3.4   POC LYMPH PERCENT 38.3 10 - 50 %L   MID (cbc) 0.3 0 - 0.9   POC MID % 3.1 0 - 12 %M   POC Granulocyte 5.2 2 - 6.9   Granulocyte percent 58.6 37 - 80 %G   RBC 4.88 4.04 - 5.48 M/uL   Hemoglobin 14.4 12.2 - 16.2 g/dL   HCT, POC 42.5 37.7 - 47.9 %   MCV 87.1 80 - 97 fL   MCH, POC 29.6 27 - 31.2 pg   MCHC 34.0 31.8 - 35.4 g/dL   RDW, POC 14.1 %   Platelet Count, POC 210 142 - 424 K/uL   MPV 7.9 0 - 99.8 fL    Assessment and Plan :   1. Chest discomfort 2. Hyperlipidemia 3. Morbid obesity, unspecified obesity type ([place]) - Will send back to Dr. Percival Spanish for recheck to r/o cardiac source for her chest discomfort. In the meantime,  patient will make dietary changes, consider restarting cholesterol medication for HL. She will also try Pepcid AC to address any possible GERD. Recheck in 4 weeks if she has not been seen by Dr. Percival Spanish.  Jaynee Eagles, PA-C [facility] [phone] 04/25/2015 8:27 AM

## 2015-05-23 ENCOUNTER — Ambulatory Visit (INDEPENDENT_AMBULATORY_CARE_PROVIDER_SITE_OTHER): Payer: BLUE CROSS/BLUE SHIELD | Admitting: Cardiology

## 2015-05-23 ENCOUNTER — Encounter: Payer: Self-pay | Admitting: Cardiology

## 2015-05-23 VITALS — BP 128/80 | HR 68 | Ht 64.5 in | Wt 254.5 lb

## 2015-05-23 DIAGNOSIS — R0789 Other chest pain: Secondary | ICD-10-CM | POA: Diagnosis not present

## 2015-05-23 NOTE — Progress Notes (Signed)
Cardiology Office Note   Date:  [id number]   ID:  Cynthia Fitzgerald, DOB [id number], MRN [id number]  PCP:  Ellsworth Lennox, MD  Cardiologist:   Minus Breeding, MD   No chief complaint on file.     History of Present Illness: Cynthia Fitzgerald is a 60 y.o. female who presents for evaluation of chest pain. I saw her in 2013. At that time she had some shortness of breath. Exercise treadmill testing was unremarkable. Coronary calcium score was 0. She did have an echocardiogram which was normal. She returns for follow-up. She's been getting some symptoms that she thinks is heartburn. This has been rare. She's had a couple of episodes of a burning discomfort under her sternum. It's mild and somewhat prolonged. She does not have associated nausea vomiting or diaphoresis. She doesn't have palpitations, presyncope or syncope. She's not having any new shortness of breath, PND or orthopnea. She has had occasional heart racing with her rates in the 90s and a strong heartbeat.  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 This is been unchanged and prompted a lot of for evaluation. She was again referred because of this.  Past Medical History  Diagnosis Date  . Hyperlipidemia   . Headache(784.0)   . Morbid obesity ([place])   . Cataract     Past Surgical History  Procedure Laterality Date  . Breast surgery      Breast reduction  . Cesarean section    . Eye surgery Right 11/13/11    cataract     Current Outpatient Prescriptions  Medication Sig Dispense Refill  . B-12, Methylcobalamin, 1000 MCG SUBL Place 1 tablet under the tongue daily.    . IBUPROFEN PO Take by mouth.    . Magnesium 250 MG TABS Take 1 tablet by mouth daily.    . Multiple Vitamin (MULTIVITAMIN) tablet Take 1 tablet by mouth daily.     No current facility-administered medications for this visit.    Allergies:   Erythromycin    Social History:  The patient  reports that she has never smoked. She  does not have any smokeless tobacco history on file. She reports that she drinks about 1.0 oz of alcohol per week.   Family History:  The patient's family history includes Cancer in her father, maternal grandmother, and paternal grandfather; Diabetes in her father and paternal grandmother; Hyperlipidemia in her father and mother; Hypertension in her father.    ROS:  Please see the history of present illness.   Otherwise, review of systems are positive for none.   All other systems are reviewed and negative.    PHYSICAL EXAM: VS:  BP 128/80 mmHg  Pulse 68  Ht 5' 4.5" (1.638 m)  Wt 254 lb 8 oz (115.44 kg)  BMI 43.03 kg/m2 , BMI Body mass index is 43.03 kg/(m^2). GENERAL:  Well appearing HEENT:  Pupils equal round and reactive, fundi not visualized, oral mucosa unremarkable NECK:  No jugular venous distention, waveform within normal limits, carotid upstroke brisk and symmetric, no bruits, no thyromegaly LYMPHATICS:  No cervical, inguinal adenopathy LUNGS:  Clear to auscultation bilaterally BACK:  No CVA tenderness CHEST:  Unremarkable HEART:  PMI not displaced or sustained,S1 and S2 within normal limits, no S3, no S4, no clicks, no rubs, no murmurs ABD:  Flat, positive bowel sounds normal in frequency in pitch, no bruits, no rebound, no guarding, no midline pulsatile mass, no hepatomegaly, no splenomegaly EXT:  2 plus pulses  throughout, no edema, no cyanosis no clubbing SKIN:  No rashes no nodules NEURO:  Cranial nerves II through XII grossly intact, motor grossly intact throughout PSYCH:  Cognitively intact, oriented to person place and time    EKG:  EKG is ordered today. The ekg ordered today demonstrates sinus rhythm, rate 69, axis within normal limits, intervals within normal limits, poor anterior R wave progression.   Recent Labs: 07/29/2014: Platelets 247 04/25/2015: ALT 21; BUN 20; Creat 0.72; Hemoglobin 14.4; Potassium 4.2; Sodium 142    Lipid Panel    Component Value  Date/Time   CHOL 233* 04/25/2015 0843   TRIG 234* 04/25/2015 0843   HDL 42* 04/25/2015 0843   CHOLHDL 5.5* 04/25/2015 0843   VLDL 47* 04/25/2015 0843   LDLCALC 144* 04/25/2015 0843      Wt Readings from Last 3 Encounters:  05/23/15 254 lb 8 oz (115.44 kg)  04/25/15 259 lb (117.482 kg)  07/29/14 256 lb (116.121 kg)      Other studies Reviewed: Additional studies/ records that were reviewed today include: EKG, echo, POET (Plain Old Exercise Treadmill), coronary calcium score Review of the above records demonstrates:  Please see elsewhere in the note.     ASSESSMENT AND PLAN:  CHEST PAIN:  This is atypical. However, there are some cardiovascular risk factors.  I will bring the patient back for a POET (Plain Old Exercise Test). This will allow me to screen for obstructive coronary disease, risk stratify and very importantly provide a prescription for exercise.  DYSLIPIDEMIA:  Her LDL is 144. Triglycerides are slightly elevated. However, there is no guideline indicated reason for a statin at this point. However, she does need significant lifestyle modification we discussed this.  OBESITY:  The patient understands the need to lose weight with diet and exercise. We have discussed specific strategies for this.     Current medicines are reviewed at length with the patient today.  The patient does not have concerns regarding medicines.  The following changes have been made:  no change  Labs/ tests ordered today include:   Orders Placed This Encounter  Procedures  . Exercise Tolerance Test  . EKG 12-Lead     Disposition:   FU with me as needed    Signed, Minus Breeding, MD  05/23/2015 10:03 AM    [place]

## 2015-05-23 NOTE — Patient Instructions (Signed)
Dr Percival Spanish recommends that you continue on your current medications as directed. Please refer to the Current Medication list given to you today.  Your physician has requested that you have an exercise tolerance test. For further information please visit [email or web address]. Please also follow instruction sheet, as given.  Dr Percival Spanish recommends that you follow-up with him as needed.

## 2015-05-25 ENCOUNTER — Encounter: Payer: Self-pay | Admitting: Cardiology

## 2015-05-27 ENCOUNTER — Telehealth (HOSPITAL_COMMUNITY): Payer: Self-pay

## 2015-05-27 NOTE — Telephone Encounter (Signed)
Encounter complete. 

## 2015-06-01 ENCOUNTER — Ambulatory Visit (HOSPITAL_COMMUNITY)
Admission: RE | Admit: 2015-06-01 | Discharge: 2015-06-01 | Disposition: A | Discharge status: Home / Self Care | Payer: BLUE CROSS/BLUE SHIELD | Source: Ambulatory Visit | Attending: Cardiology | Admitting: Cardiology

## 2015-06-01 DIAGNOSIS — R0789 Other chest pain: Secondary | ICD-10-CM | POA: Diagnosis not present

## 2015-06-01 LAB — EXERCISE TOLERANCE TEST
CHL CUP RESTING HR STRESS: 71 {beats}/min
CSEPED: 6 min
CSEPEDS: 36 s
Estimated workload: 7.9 METS
MPHR: 160 {beats}/min
Peak HR: 153 {beats}/min
Percent HR: 95 %
RPE: 17

## 2016-01-17 LAB — HM MAMMOGRAPHY

## 2016-05-11 ENCOUNTER — Ambulatory Visit (INDEPENDENT_AMBULATORY_CARE_PROVIDER_SITE_OTHER): Payer: 59 | Admitting: Physician Assistant

## 2016-05-11 VITALS — BP 126/94 | HR 72 | Temp 98.4°F | Resp 16 | Ht 63.75 in | Wt 261.8 lb

## 2016-05-11 DIAGNOSIS — M79605 Pain in left leg: Secondary | ICD-10-CM | POA: Diagnosis not present

## 2016-05-11 DIAGNOSIS — R030 Elevated blood-pressure reading, without diagnosis of hypertension: Secondary | ICD-10-CM

## 2016-05-11 MED ORDER — T.E.D. BELOW KNEE/LARGE MISC: 1.0000 | Freq: Every day | 1 refills | Status: DC

## 2016-05-11 NOTE — Patient Instructions (Addendum)
  Lets start with compressions socks while we are waiting on your referral   IF you received an x-ray today, you will receive an invoice from [facility]. Please contact [facility] at [phone] with questions or concerns regarding your invoice.   IF you received labwork today, you will receive an invoice from [facility]. Please contact [facility] at [phone] with questions or concerns regarding your invoice.   Our billing staff will not be able to assist you with questions regarding bills from these companies.  You will be contacted with the lab results as soon as they are available. The fastest way to get your results is to activate your My Chart account. Instructions are located on the last page of this paperwork. If you have not heard from [place] regarding the results in 2 weeks, please contact this office.

## 2016-05-11 NOTE — Progress Notes (Signed)
Cynthia Fitzgerald  MRN: [id number] DOB: [birth year]  Subjective:  Pt presents to clinic with left lower leg pain for the last couple of months - it is located mainly anterior leg and upper shin area it is aching - worse at night - somedays no problems - it is happening more often but not more achy -- she is using ice and heat which does help.  There is a small swollen area on the medal aspect of upper shin that does hurt at time.  She does not notice swelling at night.  She has not been traveling long periods of time recently.  No trauma to the leg.  H/o bakers cyst after a trip - this feel different  Maternal grandmother had varicose veins.  Review of Systems  Constitutional: Negative for chills and fever.  Musculoskeletal: Negative for gait problem, joint swelling and myalgias.    Patient Active Problem List   Diagnosis Date Noted  . Chest pain, atypical 10/22/2011  . Hyperlipidemia 10/22/2011  . Headache disorder 10/22/2011  . Obesity, Class III, BMI 40-49.9 (morbid obesity) ([place]) 10/22/2011  . Family history of breast cancer in first degree relative 10/22/2011  . Family history of uterine cancer 10/22/2011  . Family history of prostate cancer 10/22/2011  . Family history of Alzheimer's disease 10/22/2011    Current Outpatient Prescriptions on File Prior to Visit  Medication Sig Dispense Refill  . IBUPROFEN PO Take by mouth.    . Magnesium 250 MG TABS Take 1 tablet by mouth daily.    . Multiple Vitamin (MULTIVITAMIN) tablet Take 1 tablet by mouth daily.    . B-12, Methylcobalamin, 1000 MCG SUBL Place 1 tablet under the tongue daily.     No current facility-administered medications on file prior to visit.     Allergies  Allergen Reactions  . Erythromycin Nausea Only    Pt patients past, family and social history were reviewed and updated.   Objective:  BP (!) 126/94 (BP Location: Left Arm, Cuff Size: Large)   Pulse 72   Temp 98.4 F (36.9 C) (Oral)   Resp 16   Ht 5'  3.75" (1.619 m)   Wt 261 lb 12.8 oz (118.8 kg)   SpO2 96%   BMI 45.29 kg/m   Physical Exam  Constitutional: She is oriented to person, place, and time and well-developed, well-nourished, and in no distress.  HENT:  Head: Normocephalic and atraumatic.  Right Ear: Hearing and external ear normal.  Left Ear: Hearing and external ear normal.  Eyes: Conjunctivae are normal.  Neck: Normal range of motion.  Pulmonary/Chest: Effort normal.  Musculoskeletal:       Right lower leg: She exhibits no edema.       Left lower leg: She exhibits no edema.       Legs: Varicosities bilateral, good DP bilaterally. No edema. No calf TTP, neg homans.  No erythema.  Neurological: She is alert and oriented to person, place, and time. Gait normal.  Skin: Skin is warm and dry.  Psychiatric: Mood, memory, affect and judgment normal.  Vitals reviewed.   Assessment and Plan :  Aching leg syndrome of left lower extremity - Plan: Ambulatory referral to Vascular Surgery, Elastic Bandages & Supports (T.E.D. BELOW KNEE/LARGE) MISC  Elevated BP without diagnosis of hypertension - pt needs to monitor  likely peripheral vascular disease with varicose veins - pt will wear compression socks until her appt with vascular on her left leg to see if that makes a different.  Windell Hummingbird PA-C  [facility] 05/14/2016 8:54 PM

## 2016-05-17 ENCOUNTER — Encounter: Payer: Self-pay | Admitting: Physician Assistant

## 2016-05-17 ENCOUNTER — Ambulatory Visit (INDEPENDENT_AMBULATORY_CARE_PROVIDER_SITE_OTHER): Payer: 59 | Admitting: Physician Assistant

## 2016-05-17 VITALS — BP 124/84 | HR 86 | Temp 98.5°F | Resp 18 | Ht 63.75 in | Wt 263.0 lb

## 2016-05-17 DIAGNOSIS — L918 Other hypertrophic disorders of the skin: Secondary | ICD-10-CM | POA: Diagnosis not present

## 2016-05-17 NOTE — Progress Notes (Signed)
   Cynthia Fitzgerald  MRN: [id number] DOB: 01-07-1956  Subjective:  Pt presents to clinic with multiple skins tags that she would like removed.  They bother her because they are on her bra line and underarms.  She has had them removed in the past.  BP at home - 120s/low 80s  Review of Systems  Patient Active Problem List   Diagnosis Date Noted  . Chest pain, atypical 10/22/2011  . Hyperlipidemia 10/22/2011  . Headache disorder 10/22/2011  . Obesity, Class III, BMI 40-49.9 (morbid obesity) ([place]) 10/22/2011  . Family history of breast cancer in first degree relative 10/22/2011  . Family history of uterine cancer 10/22/2011  . Family history of prostate cancer 10/22/2011  . Family history of Alzheimer's disease 10/22/2011    Current Outpatient Prescriptions on File Prior to Visit  Medication Sig Dispense Refill  . B-12, Methylcobalamin, 1000 MCG SUBL Place 1 tablet under the tongue daily.    . Elastic Bandages & Supports (T.E.D. BELOW KNEE/LARGE) MISC 1 Device by Does not apply route daily. Medium compression 25-35mmHg 1 each 1  . IBUPROFEN PO Take by mouth.    . Magnesium 250 MG TABS Take 1 tablet by mouth daily.    . Multiple Vitamin (MULTIVITAMIN) tablet Take 1 tablet by mouth daily.     No current facility-administered medications on file prior to visit.     Allergies  Allergen Reactions  . Erythromycin Nausea Only    Pt patients past, family and social history were reviewed and updated.   Objective:  BP 124/84   Pulse 86   Temp 98.5 F (36.9 C) (Oral)   Resp 18   Ht 5' 3.75" (1.619 m)   Wt 263 lb (119.3 kg)   SpO2 95%   BMI 45.50 kg/m   Physical Exam  Constitutional: She is oriented to person, place, and time and well-developed, well-nourished, and in no distress.  HENT:  Head: Normocephalic and atraumatic.  Right Ear: Hearing and external ear normal.  Left Ear: Hearing and external ear normal.  Eyes: Conjunctivae are normal.  Neck: Normal range of motion.    Pulmonary/Chest: Effort normal.  Neurological: She is alert and oriented to person, place, and time. Gait normal.  Skin: Skin is warm and dry.  10 skin tags removed in total from bilateral axillary region and abd.  Dressings put on areas that are bleeding.  Psychiatric: Mood, memory, affect and judgment normal.  Vitals reviewed.   Assessment and Plan :  Acquired skin tag removed without problems. Wound care d/w pt  Windell Hummingbird PA-C  [facility] at [place] 05/17/2016 4:37 PM

## 2016-05-17 NOTE — Patient Instructions (Signed)
     IF you received an x-ray today, you will receive an invoice from [facility]. Please contact [facility] at [phone] with questions or concerns regarding your invoice.   IF you received labwork today, you will receive an invoice from [facility]. Please contact [facility] at [phone] with questions or concerns regarding your invoice.   Our billing staff will not be able to assist you with questions regarding bills from these companies.  You will be contacted with the lab results as soon as they are available. The fastest way to get your results is to activate your My Chart account. Instructions are located on the last page of this paperwork. If you have not heard from us regarding the results in 2 weeks, please contact this office.     

## 2016-09-14 DIAGNOSIS — Z961 Presence of intraocular lens: Secondary | ICD-10-CM | POA: Diagnosis not present

## 2016-09-14 DIAGNOSIS — H35371 Puckering of macula, right eye: Secondary | ICD-10-CM | POA: Diagnosis not present

## 2016-12-13 ENCOUNTER — Encounter: Payer: Self-pay | Admitting: Urgent Care

## 2016-12-13 ENCOUNTER — Ambulatory Visit (INDEPENDENT_AMBULATORY_CARE_PROVIDER_SITE_OTHER): Payer: 59 | Admitting: Urgent Care

## 2016-12-13 VITALS — BP 138/86 | HR 85 | Temp 98.3°F | Resp 16 | Ht 63.75 in | Wt 257.2 lb

## 2016-12-13 DIAGNOSIS — R21 Rash and other nonspecific skin eruption: Secondary | ICD-10-CM

## 2016-12-13 DIAGNOSIS — L239 Allergic contact dermatitis, unspecified cause: Secondary | ICD-10-CM

## 2016-12-13 DIAGNOSIS — Z23 Encounter for immunization: Secondary | ICD-10-CM

## 2016-12-13 DIAGNOSIS — L299 Pruritus, unspecified: Secondary | ICD-10-CM | POA: Diagnosis not present

## 2016-12-13 MED ORDER — ZOSTER VAC RECOMB ADJUVANTED 50 MCG/0.5ML IM SUSR: 0.5000 mL | Freq: Once | INTRAMUSCULAR | 0 refills | Status: AC

## 2016-12-13 MED ORDER — RANITIDINE HCL 150 MG PO TABS: 150.0000 mg | ORAL_TABLET | Freq: Two times a day (BID) | ORAL | 0 refills | Status: DC

## 2016-12-13 MED ORDER — TRIAMCINOLONE ACETONIDE 0.1 % EX CREA: 1.0000 "application " | TOPICAL_CREAM | Freq: Two times a day (BID) | CUTANEOUS | 0 refills | Status: AC

## 2016-12-13 MED ORDER — CETIRIZINE HCL 10 MG PO TABS
10.0000 mg | ORAL_TABLET | Freq: Every day | ORAL | 11 refills | Status: DC
Start: 2016-12-13 — End: 2017-12-21

## 2016-12-13 NOTE — Patient Instructions (Addendum)
Rash A rash is a change in the color of the skin. A rash can also change the way your skin feels. There are many different conditions and factors that can cause a rash. Follow these instructions at home: Pay attention to any changes in your symptoms. Follow these instructions to help with your condition: Medicine Take or apply over-the-counter and prescription medicines only as told by your health care provider. These may include:  Corticosteroid cream.  Anti-itch lotions.  Oral antihistamines.  Skin Care  Apply cool compresses to the affected areas.  Try taking a bath with: ? Epsom salts. Follow the instructions on the packaging. You can get these at your local pharmacy or grocery store. ? Baking soda. Pour a small amount into the bath as told by your health care provider. ? Colloidal oatmeal. Follow the instructions on the packaging. You can get this at your local pharmacy or grocery store.  Try applying baking soda paste to your skin. Stir water into baking soda until it reaches a paste-like consistency.  Do not scratch or rub your skin.  Avoid covering the rash. Make sure the rash is exposed to air as much as possible. General instructions  Avoid hot showers or baths, which can make itching worse. A cold shower may help.  Avoid scented soaps, detergents, and perfumes. Use gentle soaps, detergents, perfumes, and other cosmetic products.  Avoid any substance that causes your rash. Keep a journal to help track what causes your rash. Write down: ? What you eat. ? What cosmetic products you use. ? What you drink. ? What you wear. This includes jewelry.  Keep all follow-up visits as told by your health care provider. This is important. Contact a health care provider if:  You sweat at night.  You lose weight.  You urinate more than normal.  You feel weak.  You vomit.  Your skin or the whites of your eyes look yellow (jaundice).  Your skin: ? Tingles. ? Is  numb.  Your rash: ? Does not go away after several days. ? Gets worse.  You are: ? Unusually thirsty. ? More tired than normal.  You have: ? New symptoms. ? Pain in your abdomen. ? A fever. ? Diarrhea. Get help right away if:  You develop a rash that covers all or most of your body. The rash may or may not be painful.  You develop blisters that: ? Are on top of the rash. ? Grow larger or grow together. ? Are painful. ? Are inside your nose or mouth.  You develop a rash that: ? Looks like purple pinprick-sized spots all over your body. ? Has a "bull's eye" or looks like a target. ? Is not related to sun exposure, is red and painful, and causes your skin to peel. This information is not intended to replace advice given to you by your health care provider. Make sure you discuss any questions you have with your health care provider. Document Released: 02/23/2002 Document Revised: 08/09/2015 Document Reviewed: 07/21/2014 Elsevier Interactive Patient Education  2017 [employer].     IF you received an x-ray today, you will receive an invoice from [facility]. Please contact [facility] at [phone] with questions or concerns regarding your invoice.   IF you received labwork today, you will receive an invoice from [facility]. Please contact [facility] at [phone] with questions or concerns regarding your invoice.   Our billing staff will not be able to assist you with questions regarding bills from these  companies.  You will be contacted with the lab results as soon as they are available. The fastest way to get your results is to activate your My Chart account. Instructions are located on the last page of this paperwork. If you have not heard from [place] regarding the results in 2 weeks, please contact this office.

## 2016-12-13 NOTE — Progress Notes (Signed)
    MRN: [id number] DOB: 01-18-1956  Subjective:   Cynthia Fitzgerald is a 61 y.o. female presenting for chief complaint of Rash (patient presents with rash on hands, arms and legs for the past week, patient states that she pulled some weeds at her mothers home)  Reports 1 week history of rash that started on her lower leg and has spread to her hands, forearms. Rash is very itchy and is now having some itching around her ears as well. She has been working in the yard at her mother's house, has been around her mother's dogs as well. Denies fever, facial swelling, chest tightness, shob, wheezing, n/v, abdominal pain, headache, red eyes, sore throat, cough. She has tried calamine lotion, benadryl, hots showers with minimal relief.   Cynthia Fitzgerald has a current medication list which includes the following prescription(s): ibuprofen, magnesium, multivitamin, b-12 (methylcobalamin), and t.e.d. below knee/large. Also is allergic to erythromycin.  Cynthia Fitzgerald  has a past medical history of Cataract; Headache(784.0); Hyperlipidemia; and Morbid obesity ([place]). Also  has a past surgical history that includes Breast surgery; Cesarean section; and Eye surgery (Right, 11/13/11).  Objective:   Vitals: Pulse 85   Temp 98.3 F (36.8 C) (Oral)   Resp 16   Ht 5' 3.75" (1.619 m)   Wt 257 lb 3.2 oz (116.7 kg)   SpO2 97%   BMI 44.50 kg/m   Physical Exam  Constitutional: She is oriented to person, place, and time. She appears well-developed and well-nourished.  HENT:  Mouth/Throat: Oropharynx is clear and moist.  Eyes: Right eye exhibits no discharge. Left eye exhibits no discharge.  Neck: Normal range of motion. Neck supple.  Cardiovascular: Normal rate, regular rhythm and intact distal pulses.  Exam reveals no gallop and no friction rub.   No murmur heard. Pulmonary/Chest: No respiratory distress. She has no wheezes. She has no rales.  Lymphadenopathy:    She has no cervical adenopathy.  Neurological: She is alert and  oriented to person, place, and time.  Skin: Skin is warm and dry. Rash (multiple excoriations spread out diffusely over her lower legs, hands, forearms, lateral neck on either side) noted.  Psychiatric: She has a normal mood and affect.   Assessment and Plan :   1. Allergic dermatitis 2. Rash and nonspecific skin eruption 3. Itching - Suspect allergic dermatitis to unknown offending agent. Patient is to start oral antihistamines, topical steroids. Tick-borne illness is possible but unlikely given physical exam findings, symptom set. Return-to-clinic precautions discussed, patient verbalized understanding.   4. Need for shingles vaccine - Script provided for Shingrix.  Jaynee Eagles, PA-C [facility] at [place] [phone] 12/13/2016  10:48 AM

## 2016-12-25 DIAGNOSIS — Z23 Encounter for immunization: Secondary | ICD-10-CM | POA: Diagnosis not present

## 2017-01-22 DIAGNOSIS — Z1231 Encounter for screening mammogram for malignant neoplasm of breast: Secondary | ICD-10-CM | POA: Diagnosis not present

## 2017-01-22 DIAGNOSIS — Z803 Family history of malignant neoplasm of breast: Secondary | ICD-10-CM | POA: Diagnosis not present

## 2017-01-22 LAB — HM MAMMOGRAPHY

## 2017-09-30 DIAGNOSIS — H04123 Dry eye syndrome of bilateral lacrimal glands: Secondary | ICD-10-CM | POA: Diagnosis not present

## 2017-09-30 DIAGNOSIS — H35371 Puckering of macula, right eye: Secondary | ICD-10-CM | POA: Diagnosis not present

## 2017-09-30 DIAGNOSIS — Z961 Presence of intraocular lens: Secondary | ICD-10-CM | POA: Diagnosis not present

## 2017-10-25 ENCOUNTER — Ambulatory Visit: Payer: Self-pay | Admitting: Physician Assistant

## 2017-10-25 NOTE — Telephone Encounter (Signed)
She called in to get established with Dr. Birdie Riddle at [facility].   She is waiting to see if Dr. Birdie Riddle will accept her as a new pt.  See triage notes below.  She was concerned about some of the BP readings she was getting on her machine at home and wondering if she should be seen prior to hearing from Dr. Virgil Benedict office.  Her readings over last several days were:  133/90, 117/80, 129/86, and this morning it was 141/91.   She denies having headaches or dizziness.   I let her know the readings she gave me were fine.   It's normal for our BP to fluctuate.   She won't get the same reading twice.  I educated her on how it's normal for our BP to fluctuate.    If her BP consistently stays above 140/90 or she starts having headaches or dizziness to go to the ED or since she is established with Windell Hummingbird at [place] at [facility] to see her.   She is there until 11/28/17 then she is leaving the practice at [facility].   I let the pt know this.  Pt was agreeable to this plan.  Reason for Disposition . Health Information question, no triage required and triager able to answer question  Answer Assessment - Initial Assessment Questions 1. REASON FOR CALL or QUESTION: "What is your reason for calling today?" or "How can I best help you?" or "What question do you have that I can help answer?"     She is waiting for Dr. Virgil Benedict response on whether she will accept the pt.   She is established with Windell Hummingbird at [facility] [employer].  She was wondering if the BP readings she was getting were anything to be concerned about before hearing from Dr. Birdie Riddle.  Protocols used: INFORMATION ONLY CALL-A-AH

## 2017-11-25 ENCOUNTER — Telehealth: Payer: Self-pay | Admitting: Physician Assistant

## 2017-11-25 NOTE — Telephone Encounter (Signed)
Copied from [place] [id number]. Topic: Appointment Scheduling - Scheduling Inquiry for Clinic >> Nov 25, 2017  2:55 PM Jarold Motto, Fraser Din wrote: Reason for CRM: pt called and stated that she would like information on travel vaccines. Please advise

## 2017-12-06 NOTE — Telephone Encounter (Signed)
Left message to call back  

## 2017-12-20 ENCOUNTER — Ambulatory Visit (INDEPENDENT_AMBULATORY_CARE_PROVIDER_SITE_OTHER): Payer: 59 | Admitting: Family Medicine

## 2017-12-20 DIAGNOSIS — Z23 Encounter for immunization: Secondary | ICD-10-CM

## 2017-12-21 ENCOUNTER — Encounter: Payer: Self-pay | Admitting: Family Medicine

## 2017-12-21 ENCOUNTER — Other Ambulatory Visit: Payer: Self-pay

## 2017-12-21 ENCOUNTER — Ambulatory Visit: Payer: 59 | Admitting: Family Medicine

## 2017-12-21 VITALS — BP 138/88 | HR 77 | Temp 98.3°F | Resp 20 | Ht 64.57 in | Wt 268.6 lb

## 2017-12-21 DIAGNOSIS — Z7184 Encounter for health counseling related to travel: Secondary | ICD-10-CM | POA: Diagnosis not present

## 2017-12-21 DIAGNOSIS — Z23 Encounter for immunization: Secondary | ICD-10-CM

## 2017-12-21 MED ORDER — TYPHOID VACCINE PO CPDR: 1.0000 | DELAYED_RELEASE_CAPSULE | ORAL | 0 refills | Status: DC

## 2017-12-21 NOTE — Addendum Note (Signed)
Addended by: Rutherford Guys on: 12/21/2017 11:27 AM   Modules accepted: Orders

## 2017-12-21 NOTE — Patient Instructions (Signed)
° ° ° °  If you have lab work done today you will be contacted with your lab results within the next 2 weeks.  If you have not heard from us then please contact us. The fastest way to get your results is to register for My Chart. ° ° °IF you received an x-ray today, you will receive an invoice from [facility]. Please contact [facility] at [phone] with questions or concerns regarding your invoice.  ° °IF you received labwork today, you will receive an invoice from [facility]. Please contact [facility] at [phone] with questions or concerns regarding your invoice.  ° °Our billing staff will not be able to assist you with questions regarding bills from these companies. ° °You will be contacted with the lab results as soon as they are available. The fastest way to get your results is to activate your My Chart account. Instructions are located on the last page of this paperwork. If you have not heard from us regarding the results in 2 weeks, please contact this office. °  ° ° ° °

## 2017-12-21 NOTE — Progress Notes (Addendum)
23/5/573220:25 AM  Erroll Luna [birth year], 62 y.o. female [id number]  Chief Complaint  Patient presents with  . Establish Care     rx for Typhoid    HPI:   Patient is a 62 y.o. female who presents today for oral thyroid prescription  She will be leaving to [place] and [place] for fun Had hep A #1 yesterday Has had yellow fever vaccine, malaria prophylaxis and travelers diarrhea prescription Has never had typhoid vaccine Seen at [facility] She will be getting flu vaccine next week thru work   Fall Risk  12/21/2017 12/13/2016 05/17/2016 05/11/2016 07/29/2014  Falls in the past year? No No No No No     Depression screen [facility] 2/9 12/21/2017 12/13/2016 05/17/2016  Decreased Interest 0 0 0  Down, Depressed, Hopeless 0 1 0  PHQ - 2 Score 0 1 0    Allergies  Allergen Reactions  . Erythromycin Nausea Only    Prior to Admission medications   Medication Sig Start Date End Date Taking? Authorizing Provider  Elastic Bandages & Supports (T.E.D. BELOW KNEE/LARGE) MISC 1 Device by Does not apply route daily. Medium compression 25-35mmHg 05/11/16  Yes Weber, Sarah L, PA-C  IBUPROFEN PO Take by mouth.   Yes [provider]  Magnesium 250 MG TABS Take 1 tablet by mouth daily.   Yes [provider]  Multiple Vitamin (MULTIVITAMIN) tablet Take 1 tablet by mouth daily.   Yes [provider]  B-12, Methylcobalamin, 1000 MCG SUBL Place 1 tablet under the tongue daily.    [provider]  cetirizine (ZYRTEC) 10 MG tablet Take 1 tablet (10 mg total) by mouth daily. Patient not taking: Reported on 12/21/2017 12/13/16   Jaynee Eagles, PA-C  ranitidine (ZANTAC) 150 MG tablet Take 1 tablet (150 mg total) by mouth 2 (two) times daily. Patient not taking: Reported on 12/21/2017 12/13/16   Jaynee Eagles, PA-C  triamcinolone cream (KENALOG) 0.1 % Apply 1 application topically 2 (two) times daily. Patient not taking: Reported on 12/21/2017 12/13/16   Jaynee Eagles, PA-C     Past Medical History:  Diagnosis Date  . Cataract   . Headache(784.0)   . Hyperlipidemia   . Morbid obesity ([place])     Past Surgical History:  Procedure Laterality Date  . BREAST SURGERY     Breast reduction  . CESAREAN SECTION    . EYE SURGERY Right 11/13/11   cataract    Social History   Tobacco Use  . Smoking status: Never Smoker  . Smokeless tobacco: Never Used  Substance Use Topics  . Alcohol use: Yes    Alcohol/week: 2.0 standard drinks    Types: 2 Standard drinks or equivalent per week    Comment: few drinks per week    Family History  Problem Relation Age of Onset  . Hyperlipidemia Mother   . Cancer Mother        breast cancer  . Hypertension Father   . Diabetes Father   . Hyperlipidemia Father   . Cancer Father        prostate  . Cancer Maternal Grandmother        uterine and cervical  . Diabetes Paternal Grandmother   . Cancer Paternal Grandfather        Esophageal and lung cancer  . Cancer Sister        breast cancer  . Hyperlipidemia Sister   . Diabetes Brother   . Hypertension Brother   . Hyperlipidemia Brother   .  Hyperlipidemia Sister     ROS Per hpi  OBJECTIVE:  Blood pressure (!) 142/92, pulse 77, temperature 98.3 F (36.8 C), temperature source Oral, resp. rate 20, height 5' 4.57" (1.64 m), weight 268 lb 9.6 oz (121.8 kg), SpO2 95 %. Body mass index is 45.3 kg/m.   Repeat BP 138/88  BP Readings from Last 3 Encounters:  12/21/17 (!) 142/92  12/13/16 138/86  05/17/16 124/84    Physical Exam  Constitutional: She is oriented to person, place, and time. She appears well-developed and well-nourished.  HENT:  Head: Normocephalic and atraumatic.  Mouth/Throat: Mucous membranes are normal.  Eyes: Pupils are equal, round, and reactive to light. Conjunctivae and EOM are normal. No scleral icterus.  Neck: Neck supple.  Pulmonary/Chest: Effort normal.  Neurological: She is alert and oriented to person, place, and time.  Skin:  Skin is warm and dry.  Psychiatric: She has a normal mood and affect.  Nursing note and vitals reviewed.   ASSESSMENT and PLAN  1. Need for immunization against typhoid - typhoid (VIVOTIF) DR capsule; Take 1 capsule by mouth every other day.  Return if symptoms worsen or fail to improve.    Rutherford Guys, MD Primary Care at [facility] [place], [place] [address] Ph.  [phone] Fax [phone]

## 2017-12-24 DIAGNOSIS — Z23 Encounter for immunization: Secondary | ICD-10-CM | POA: Diagnosis not present

## 2018-01-06 DIAGNOSIS — M2042 Other hammer toe(s) (acquired), left foot: Secondary | ICD-10-CM | POA: Diagnosis not present

## 2018-01-06 DIAGNOSIS — M21612 Bunion of left foot: Secondary | ICD-10-CM | POA: Diagnosis not present

## 2018-01-06 DIAGNOSIS — M21611 Bunion of right foot: Secondary | ICD-10-CM | POA: Diagnosis not present

## 2018-01-06 DIAGNOSIS — G5762 Lesion of plantar nerve, left lower limb: Secondary | ICD-10-CM | POA: Diagnosis not present

## 2018-01-27 DIAGNOSIS — Z803 Family history of malignant neoplasm of breast: Secondary | ICD-10-CM | POA: Diagnosis not present

## 2018-01-27 DIAGNOSIS — Z1231 Encounter for screening mammogram for malignant neoplasm of breast: Secondary | ICD-10-CM | POA: Diagnosis not present

## 2018-01-27 LAB — HM MAMMOGRAPHY

## 2018-01-28 DIAGNOSIS — M21612 Bunion of left foot: Secondary | ICD-10-CM | POA: Diagnosis not present

## 2018-01-28 DIAGNOSIS — G5762 Lesion of plantar nerve, left lower limb: Secondary | ICD-10-CM | POA: Diagnosis not present

## 2018-01-28 DIAGNOSIS — M21611 Bunion of right foot: Secondary | ICD-10-CM | POA: Diagnosis not present

## 2018-01-30 NOTE — Addendum Note (Signed)
Addended by: Amalia Hailey on: 01/30/2018 10:34 AM   Modules accepted: Orders

## 2018-02-26 DIAGNOSIS — G5762 Lesion of plantar nerve, left lower limb: Secondary | ICD-10-CM | POA: Diagnosis not present

## 2018-02-26 DIAGNOSIS — M2042 Other hammer toe(s) (acquired), left foot: Secondary | ICD-10-CM | POA: Diagnosis not present

## 2018-02-26 DIAGNOSIS — M21611 Bunion of right foot: Secondary | ICD-10-CM | POA: Diagnosis not present

## 2018-02-26 DIAGNOSIS — M21612 Bunion of left foot: Secondary | ICD-10-CM | POA: Diagnosis not present

## 2018-03-31 DIAGNOSIS — M2042 Other hammer toe(s) (acquired), left foot: Secondary | ICD-10-CM | POA: Diagnosis not present

## 2018-03-31 DIAGNOSIS — G5762 Lesion of plantar nerve, left lower limb: Secondary | ICD-10-CM | POA: Diagnosis not present

## 2018-03-31 DIAGNOSIS — M21612 Bunion of left foot: Secondary | ICD-10-CM | POA: Diagnosis not present

## 2018-04-16 DIAGNOSIS — G5762 Lesion of plantar nerve, left lower limb: Secondary | ICD-10-CM | POA: Diagnosis not present

## 2018-05-27 DIAGNOSIS — G5762 Lesion of plantar nerve, left lower limb: Secondary | ICD-10-CM | POA: Diagnosis not present

## 2018-05-27 DIAGNOSIS — M21612 Bunion of left foot: Secondary | ICD-10-CM | POA: Diagnosis not present

## 2018-10-09 ENCOUNTER — Other Ambulatory Visit: Payer: Self-pay

## 2018-10-09 ENCOUNTER — Ambulatory Visit (INDEPENDENT_AMBULATORY_CARE_PROVIDER_SITE_OTHER): Payer: 59

## 2018-10-09 ENCOUNTER — Encounter: Payer: Self-pay | Admitting: Family Medicine

## 2018-10-09 ENCOUNTER — Ambulatory Visit: Payer: 59 | Admitting: Family Medicine

## 2018-10-09 VITALS — BP 128/74 | HR 84 | Temp 98.5°F | Resp 18 | Ht 64.0 in

## 2018-10-09 DIAGNOSIS — R0683 Snoring: Secondary | ICD-10-CM

## 2018-10-09 DIAGNOSIS — R0600 Dyspnea, unspecified: Secondary | ICD-10-CM

## 2018-10-09 DIAGNOSIS — R609 Edema, unspecified: Secondary | ICD-10-CM | POA: Diagnosis not present

## 2018-10-09 DIAGNOSIS — R0609 Other forms of dyspnea: Secondary | ICD-10-CM | POA: Diagnosis not present

## 2018-10-09 NOTE — Progress Notes (Signed)
8/29/937169:67 AM  Cynthia Fitzgerald [birth year], 63 y.o., female [id number]  Chief Complaint  Patient presents with  . Edema    both legs but more in the R, x2 months, no meds taken    HPI:   Patient is a 63 y.o. female  who presents today for bilateral edema x 2 months  Wakes up with normal legs R > L  Progresses during the day Also affects her hands, tight rings Swelling upto her knees Used to happen once a year, however now more persistent Has become sedentary due to neuroma on left foot  Denies chest pain Has noticed DOE, cant go down and up her drive away wo mild DOE Now recent palpitations Snores if she sleeps on her back, has been advised to have sleep study but patient has not pursued Denies orthopnea Denies cough or wheezing Has increased reflux Denies any abd pain, nausea or vomiting, change in bm Denies any urinary issues, pelvic pain, vaginal bleeding Has gained weight  Stress test in 2017 - normal Echo 2017 - LVEF 55- 60% normal  Scheduled for physical in aug  Depression screen [facility] 2/9 10/09/2018 12/21/2017 12/13/2016  Decreased Interest 0 0 0  Down, Depressed, Hopeless 0 0 1  PHQ - 2 Score 0 0 1    Fall Risk  10/09/2018 12/21/2017 12/13/2016 05/17/2016 05/11/2016  Falls in the past year? 0 No No No No  Number falls in past yr: 0 - - - -  Follow up Falls evaluation completed - - - -     Allergies  Allergen Reactions  . Erythromycin Nausea Only    Prior to Admission medications   Medication Sig Start Date End Date Taking? Authorizing Provider  Elastic Bandages & Supports (T.E.D. BELOW KNEE/LARGE) MISC 1 Device by Does not apply route daily. Medium compression 25-35mmHg 05/11/16  Yes Weber, Sarah L, PA-C  IBUPROFEN PO Take by mouth.   Yes [provider]  Magnesium 250 MG TABS Take 1 tablet by mouth daily.   Yes [provider]  Multiple Vitamin (MULTIVITAMIN) tablet Take 1 tablet by mouth daily.   Yes [provider]  typhoid  (VIVOTIF) DR capsule Take 1 capsule by mouth every other day. 12/21/17  Yes Rutherford Guys, MD  ranitidine (ZANTAC) 150 MG tablet Take 1 tablet (150 mg total) by mouth 2 (two) times daily. Patient not taking: Reported on 12/21/2017 12/13/16   Jaynee Eagles, PA-C  triamcinolone cream (KENALOG) 0.1 % Apply 1 application topically 2 (two) times daily. Patient not taking: Reported on 12/21/2017 12/13/16   Jaynee Eagles, PA-C    Past Medical History:  Diagnosis Date  . Cataract   . Headache(784.0)   . Hyperlipidemia   . Morbid obesity ([place])     Past Surgical History:  Procedure Laterality Date  . BREAST SURGERY     Breast reduction  . CESAREAN SECTION    . EYE SURGERY Right 11/13/11   cataract    Social History   Tobacco Use  . Smoking status: Never Smoker  . Smokeless tobacco: Never Used  Substance Use Topics  . Alcohol use: Yes    Alcohol/week: 2.0 standard drinks    Types: 2 Standard drinks or equivalent per week    Comment: few drinks per week    Family History  Problem Relation Age of Onset  . Hyperlipidemia Mother   . Cancer Mother        breast cancer  . Hypertension Father   . Diabetes Father   .  Hyperlipidemia Father   . Cancer Father        prostate  . Cancer Maternal Grandmother        uterine and cervical  . Diabetes Paternal Grandmother   . Cancer Paternal Grandfather        Esophageal and lung cancer  . Cancer Sister        breast cancer  . Hyperlipidemia Sister   . Diabetes Brother   . Hypertension Brother   . Hyperlipidemia Brother   . Hyperlipidemia Sister     ROS Per hpi  OBJECTIVE:  Today's Vitals   10/09/18 1134  BP: 133/84  Pulse: 84  Resp: 18  Temp: 98.5 F (36.9 C)  TempSrc: Oral  SpO2: 99%  Height: 5\' 4"  (1.626 m)   Body mass index is 46.11 kg/m.   Physical Exam Vitals signs and nursing note reviewed.  Constitutional:      Appearance: She is well-developed.  HENT:     Head: Normocephalic and atraumatic.     Mouth/Throat:      Pharynx: No oropharyngeal exudate.  Eyes:     General: No scleral icterus.    Conjunctiva/sclera: Conjunctivae normal.     Pupils: Pupils are equal, round, and reactive to light.  Neck:     Musculoskeletal: Neck supple.     Vascular: No carotid bruit.  Cardiovascular:     Rate and Rhythm: Normal rate and regular rhythm.     Pulses: Normal pulses.     Heart sounds: Normal heart sounds. No murmur. No friction rub. No gallop.   Pulmonary:     Effort: Pulmonary effort is normal.     Breath sounds: Normal breath sounds. No wheezing, rhonchi or rales.  Musculoskeletal:     Right lower leg: Edema present.     Left lower leg: Edema present.     Comments: trace pitting edema to knees  Skin:    General: Skin is warm and dry.  Neurological:     Mental Status: She is alert and oriented to person, place, and time.        Dg Chest 2 View  Result Date: 10/09/2018 CLINICAL DATA:  Edema, unspecified type DOE (dyspnea on exertion) EXAM: CHEST - 2 VIEW COMPARISON:  03/23/2014 FINDINGS: The heart size and mediastinal contours are within normal limits. Both lungs are clear. The visualized skeletal structures are unremarkable. IMPRESSION: No active cardiopulmonary disease. Electronically Signed   By: Nolon Nations M.D.   On: 10/09/2018 12:35   Ekg: NSR, HR 78, q waves v1-v3, unchanged from 2017   ASSESSMENT and PLAN  1. Edema, unspecified type History supports dependent edema, unremarkable exam, ekg and cxr. Labs pending, discussed conservative measures. - DG Chest 2 View; Future - CBC - Comprehensive metabolic panel - TSH  2. DOE (dyspnea on exertion) So far eval reassuring. Most likely deconditioning for recent sedentary lifestyle and weight gain. Labs pending. - DG Chest 2 View; Future - EKG 12-Lead - CBC - Comprehensive metabolic panel - TSH - Brain natriuretic peptide  3. Snoring - Ambulatory referral to Sleep Studies  Return for as scheduled.    Rutherford Guys, MD  Primary Care at [facility], [place] [address] Ph.  [phone] Fax [phone]

## 2018-10-09 NOTE — Patient Instructions (Signed)
Peripheral Edema  Peripheral edema is swelling that is caused by a buildup of fluid. Peripheral edema most often affects the lower legs, ankles, and feet. It can also develop in the arms, hands, and face. The area of the body that has peripheral edema will look swollen. It may also feel heavy or warm. Your clothes may start to feel tight. Pressing on the area may make a temporary dent in your skin. You may not be able to move your swollen arm or leg as much as usual. There are many causes of peripheral edema. It can happen because of a complication of other conditions such as congestive heart failure, kidney disease, or a problem with your blood circulation. It also can be a side effect of certain medicines or because of an infection. It often happens to women during pregnancy. Sometimes, the cause is not known. Follow these instructions at home: Managing pain, stiffness, and swelling   Raise (elevate) your legs while you are sitting or lying down.  Move around often to prevent stiffness and to lessen swelling.  Do not sit or stand for long periods of time.  Wear support stockings as told by your health care provider. Medicines  Take over-the-counter and prescription medicines only as told by your health care provider.  Your health care provider may prescribe medicine to help your body get rid of excess water (diuretic). General instructions  Pay attention to any changes in your symptoms.  Follow instructions from your health care provider about limiting salt (sodium) in your diet. Sometimes, eating less salt may reduce swelling.  Moisturize skin daily to help prevent skin from cracking and draining.  Keep all follow-up visits as told by your health care provider. This is important. Contact a health care provider if you have:  A fever.  Edema that starts suddenly or is getting worse, especially if you are pregnant or have a medical condition.  Swelling in only one leg.  Increased  swelling, redness, or pain in one or both of your legs.  Drainage or sores at the area where you have edema. Get help right away if you:  Develop shortness of breath, especially when you are lying down.  Have pain in your chest or abdomen.  Feel weak.  Feel faint. Summary  Peripheral edema is swelling that is caused by a buildup of fluid. Peripheral edema most often affects the lower legs, ankles, and feet.  Move around often to prevent stiffness and to lessen swelling. Do not sit or stand for long periods of time.  Pay attention to any changes in your symptoms.  Contact a health care provider if you have edema that starts suddenly or is getting worse, especially if you are pregnant or have a medical condition.  Get help right away if you develop shortness of breath, especially when lying down. This information is not intended to replace advice given to you by your health care provider. Make sure you discuss any questions you have with your health care provider. Document Released: 04/12/2004 Document Revised: 11/27/2017 Document Reviewed: 11/27/2017 Elsevier Patient Education  2020 [employer].

## 2018-10-10 LAB — COMPREHENSIVE METABOLIC PANEL
ALT: 25 IU/L (ref 0–32)
AST: 25 IU/L (ref 0–40)
Albumin/Globulin Ratio: 1.8 (ref 1.2–2.2)
Albumin: 4.5 g/dL (ref 3.8–4.8)
Alkaline Phosphatase: 77 IU/L (ref 39–117)
BUN/Creatinine Ratio: 26 (ref 12–28)
BUN: 15 mg/dL (ref 8–27)
Bilirubin Total: 0.4 mg/dL (ref 0.0–1.2)
CO2: 21 mmol/L (ref 20–29)
Calcium: 9.4 mg/dL (ref 8.7–10.3)
Chloride: 104 mmol/L (ref 96–106)
Creatinine, Ser: 0.58 mg/dL (ref 0.57–1.00)
GFR calc Af Amer: 113 mL/min/{1.73_m2} (ref >59)
GFR calc non Af Amer: 98 mL/min/{1.73_m2} (ref >59)
Globulin, Total: 2.5 g/dL (ref 1.5–4.5)
Glucose: 99 mg/dL (ref 65–99)
Potassium: 4.6 mmol/L (ref 3.5–5.2)
Sodium: 140 mmol/L (ref 134–144)
Total Protein: 7 g/dL (ref 6.0–8.5)

## 2018-10-10 LAB — CBC
Hematocrit: 43.9 % (ref 34.0–46.6)
Hemoglobin: 14.9 g/dL (ref 11.1–15.9)
MCH: 30 pg (ref 26.6–33.0)
MCHC: 33.9 g/dL (ref 31.5–35.7)
MCV: 88 fL (ref 79–97)
Platelets: 221 10*3/uL (ref 150–450)
RBC: 4.97 x10E6/uL (ref 3.77–5.28)
RDW: 13.7 % (ref 11.7–15.4)
WBC: 10.7 10*3/uL (ref 3.4–10.8)

## 2018-10-10 LAB — BRAIN NATRIURETIC PEPTIDE: BNP: 12.7 pg/mL (ref 0.0–100.0)

## 2018-10-10 LAB — TSH: TSH: 1.85 u[IU]/mL (ref 0.450–4.500)

## 2018-10-21 ENCOUNTER — Ambulatory Visit (INDEPENDENT_AMBULATORY_CARE_PROVIDER_SITE_OTHER): Payer: 59 | Admitting: Neurology

## 2018-10-21 ENCOUNTER — Encounter: Payer: Self-pay | Admitting: Neurology

## 2018-10-21 ENCOUNTER — Other Ambulatory Visit: Payer: Self-pay

## 2018-10-21 VITALS — BP 172/108 | HR 90 | Ht 64.5 in | Wt 279.0 lb

## 2018-10-21 DIAGNOSIS — R0681 Apnea, not elsewhere classified: Secondary | ICD-10-CM

## 2018-10-21 DIAGNOSIS — R519 Headache, unspecified: Secondary | ICD-10-CM

## 2018-10-21 DIAGNOSIS — R0683 Snoring: Secondary | ICD-10-CM | POA: Diagnosis not present

## 2018-10-21 DIAGNOSIS — G4719 Other hypersomnia: Secondary | ICD-10-CM

## 2018-10-21 DIAGNOSIS — Z820 Family history of epilepsy and other diseases of the nervous system: Secondary | ICD-10-CM

## 2018-10-21 DIAGNOSIS — R51 Headache: Secondary | ICD-10-CM

## 2018-10-21 DIAGNOSIS — Z6841 Body Mass Index (BMI) 40.0 and over, adult: Secondary | ICD-10-CM

## 2018-10-21 NOTE — Progress Notes (Signed)
Subjective:    Patient ID: Cynthia Fitzgerald is a 63 y.o. female.  HPI     Star Age, MD, PhD [facility] [address] [place], [place] [address]  Dear Dr.  Pamella Pert, I saw your patient, Cynthia Fitzgerald, upon your kind request in my sleep clinic today for initial consultation of her sleep disorder, in particular, concern for underlying obstructive sleep apnea.  The patient is unaccompanied today.  As you know, [name] is a 63 year old right-handed woman with an underlying medical history of hyperlipidemia, headaches, history of cataracts, and morbid obesity with a BMI of over 45, who reports snoring and recent episodes of palpitation and shortness of breath. Her husband has noted pauses and snorting sounds while she is asleep. I reviewed your office note from 10/09/2018. Her Epworth sleepiness score is 14 out of 24, fatigue severity score is 23 out of 63. She was previously encouraged by her primary care physician to seek testing for sleep apnea but has been reluctant to.  She has apprehensive, she has a history of claustrophobia and feels like she would not be able to sleep with something on her face.  Lately, she lost a friend after having complications from sleep apnea she reports.  She also reports that her brother was tested positive for sleep apnea with a sleep study.  She is willing to consider a home sleep test.  She has a bedtime of around 11 and rise time around 5:30 AM.  She lives with her husband.  She has 1 grown daughter.  She is a non-smoker and drinks alcohol very occasionally, maybe on the weekends, no day-to-day caffeine typically.  She has a TV on at night and turns it off before falling asleep.  The TV helps her get distracted at night.  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  She has gained some weight and recently has noticed edema in her legs.  She has occasionally woken up with a headache, does not have night  to night nocturia.  Her Past Medical History Is Significant For: Past Medical History:  Diagnosis Date  . Cataract   . Headache(784.0)   . Hyperlipidemia   . Morbid obesity ([place])     Her Past Surgical History Is Significant For: Past Surgical History:  Procedure Laterality Date  . BREAST SURGERY     Breast reduction  . CESAREAN SECTION    . EYE SURGERY Right 11/13/11   cataract    Her Family History Is Significant For: Family History  Problem Relation Age of Onset  . Hyperlipidemia Mother   . Cancer Mother        breast cancer  . Hypertension Father   . Diabetes Father   . Hyperlipidemia Father   . Cancer Father        prostate  . Cancer Maternal Grandmother        uterine and cervical  . Diabetes Paternal Grandmother   . Cancer Paternal Grandfather        Esophageal and lung cancer  . Cancer Sister        breast cancer  . Hyperlipidemia Sister   . Diabetes Brother   . Hypertension Brother   . Hyperlipidemia Brother   . Hyperlipidemia Sister     Her Social History Is Significant For: Social History   Socioeconomic History  . Marital status: Married    Spouse name: Not on file  . Number of children: 1  .  Years of education: Not on file  . Highest education level: Not on file  Occupational History    Employer: [employer]  . Financial resource strain: Not on file  . Food insecurity    Worry: Not on file    Inability: Not on file  . Transportation needs    Medical: Not on file    Non-medical: Not on file  Tobacco Use  . Smoking status: Never Smoker  . Smokeless tobacco: Never Used  Substance and Sexual Activity  . Alcohol use: Yes    Alcohol/week: 2.0 standard drinks    Types: 2 Standard drinks or equivalent per week    Comment: few drinks per week  . Drug use: Never  . Sexual activity: Yes  Lifestyle  . Physical activity    Days per week: Not on file    Minutes per session: Not on file  . Stress: Not on file  Relationships   . Social Herbalist on phone: Not on file    Gets together: Not on file    Attends religious service: Not on file    Active member of club or organization: Not on file    Attends meetings of clubs or organizations: Not on file    Relationship status: Not on file  Other Topics Concern  . Not on file  Social History Narrative   Lives with husband.    Her Allergies Are:  Allergies  Allergen Reactions  . Erythromycin Nausea Only  :   Her Current Medications Are:  Outpatient Encounter Medications as of 10/21/2018  Medication Sig  . Elastic Bandages & Supports (T.E.D. BELOW KNEE/LARGE) MISC 1 Device by Does not apply route daily. Medium compression 25-35mmHg  . IBUPROFEN PO Take by mouth.  . Magnesium 250 MG TABS Take 1 tablet by mouth daily.  . Multiple Vitamin (MULTIVITAMIN) tablet Take 1 tablet by mouth daily.  . ranitidine (ZANTAC) 150 MG tablet Take 1 tablet (150 mg total) by mouth 2 (two) times daily.  Marland Kitchen triamcinolone cream (KENALOG) 0.1 % Apply 1 application topically 2 (two) times daily.  . [DISCONTINUED] typhoid (VIVOTIF) DR capsule Take 1 capsule by mouth every other day.   No facility-administered encounter medications on file as of 10/21/2018.   :  Review of Systems:  Out of a complete 14 point review of systems, all are reviewed and negative with the exception of these symptoms as listed below:  Review of Systems  Neurological:       Pt presents today to discuss her sleep. Pt has never had a sleep study but does endorse snoring.  Epworth Sleepiness Scale 0= would never doze 1= slight chance of dozing 2= moderate chance of dozing 3= high chance of dozing  Sitting and reading: 3 Watching TV: 3 Sitting inactive in a public place (ex. Theater or meeting): 0 As a passenger in a car for an hour without a break: 3 Lying down to rest in the afternoon: 3 Sitting and talking to someone: 0 Sitting quietly after lunch (no alcohol):0-2 In a car, while stopped in  traffic: 0 Total: 13.5     Objective:  Neurological Exam  Physical Exam Physical Examination:   Vitals:   10/21/18 1621  BP: (!) 172/108  Pulse: 90    General Examination: The patient is a very pleasant 63 y.o. female in no acute distress. She appears well-developed and well-nourished and well groomed.   HEENT: Normocephalic, atraumatic, pupils are equal, round and  reactive to light and accommodation. Funduscopic exam is normal with sharp disc margins noted. Extraocular tracking is good without limitation to gaze excursion or nystagmus noted. Normal smooth pursuit is noted. Hearing is grossly intact. Tympanic membranes are clear bilaterally. Face is symmetric with normal facial animation and normal facial sensation. Speech is clear with no dysarthria noted. There is no hypophonia. There is no lip, neck/head, jaw or voice tremor. Neck is supple with full range of passive and active motion. There are no carotid bruits on auscultation. Oropharynx exam reveals: mild mouth dryness, adequate dental hygiene and moderate airway crowding, due to Small airway entry, redundant soft palate, tonsillar size of 1-2+, right side a little bigger than left.  Tongue protrudes centrally in palate elevates symmetrically.  Mallampati is class II.  Neck circumference is 16-7/8 inches.  She has a mild to moderate overbite.  Nasal inspection shows left nostril a little smaller than right but otherwise no mucosal bogginess, no inferior turbinate hypertrophy, no septal deviation noted.   Chest: Clear to auscultation without wheezing, rhonchi or crackles noted.  Heart: S1+S2+0, regular and normal without murmurs, rubs or gallops noted.   Abdomen: Soft, non-tender and non-distended with normal bowel sounds appreciated on auscultation.  Extremities: There is 1+ pitting edema in the distal Right lower extremity, trace in the left lower extremity.  Skin: Warm and dry without trophic changes noted.  Musculoskeletal:  exam reveals no obvious joint deformities, tenderness or joint swelling or erythema.   Neurologically:  Mental status: The patient is awake, alert and oriented in all 4 spheres. Her immediate and remote memory, attention, language skills and fund of knowledge are appropriate. There is no evidence of aphasia, agnosia, apraxia or anomia. Speech is clear with normal prosody and enunciation. Thought process is linear. Mood is normal and affect is normal.  Cranial nerves II - XII are as described above under HEENT exam. In addition: shoulder shrug is normal with equal shoulder height noted. Motor exam: Normal bulk, strength and tone is noted. There is no tremor, fine motor skills and coordination: grossly intact.  Cerebellar testing: No dysmetria or intention tremor. There is no truncal or gait ataxia.  Sensory exam: intact to light touch in the upper and lower extremities.  Gait, station and balance: She stands easily. No veering to one side is noted. No leaning to one side is noted. Posture is age-appropriate and stance is narrow based. Gait shows normal stride length and normal pace. No problems turning are noted.   Assessment and Plan:   In summary, Kenadi Miltner is a very pleasant 63 y.o.-year old female with an underlying medical history of hyperlipidemia, headaches, history of cataracts, and morbid obesity with a BMI of over 45, whose history and physical exam are concerning for obstructive sleep apnea (OSA). I had a long chat with the patient about my findings and the diagnosis of OSA, its prognosis and treatment options. We talked about medical treatments, surgical interventions and non-pharmacological approaches. I explained in particular the risks and ramifications of untreated moderate to severe OSA, especially with respect to developing cardiovascular disease down the Road, including congestive heart failure, difficult to treat hypertension, cardiac arrhythmias, or stroke. Even type 2 diabetes  has, in part, been linked to untreated OSA. Symptoms of untreated OSA include daytime sleepiness, memory problems, mood irritability and mood disorder such as depression and anxiety, lack of energy, as well as recurrent headaches, especially morning headaches. We talked about trying to maintain a healthy lifestyle in general,  as well as the importance of weight control. I encouraged the patient to eat healthy, exercise daily and keep well hydrated, to keep a scheduled bedtime and wake time routine, to not skip any meals and eat healthy snacks in between meals. I advised the patient not to drive when feeling sleepy. I recommended the following at this time: sleep study; She would like to proceed with a home sleep test  I explained the sleep test procedure to the patient and also outlined possible surgical and non-surgical treatment options of OSA, including the use of a custom-made dental device (which would require a referral to a specialist dentist or oral surgeon), upper airway surgical options, such as pillar implants, radiofrequency surgery, tongue base surgery, and UPPP (which would involve a referral to an ENT surgeon). Rarely, jaw surgery such as mandibular advancement may be considered.  I also explained the CPAP treatment option to the patient, who indicated that she would be willing to try CPAP or autoPAP if the need arises. I explained the importance of being compliant with PAP treatment, not only for insurance purposes but primarily to improve Her symptoms, and for the patient's long term health benefit, including to reduce Her cardiovascular risks. I answered all her questions today and the patient was in agreement. I plan to see her back after the sleep study is completed and encouraged her to call with any interim questions, concerns, problems or updates.   Thank you very much for allowing me to participate in the care of this nice patient. If I can be of any further assistance to you please do  not hesitate to call me at [phone].  Sincerely,   Star Age, MD, PhD

## 2018-10-21 NOTE — Patient Instructions (Signed)
Thank you for choosing [facility] for your sleep related care! It was nice to meet you today! I appreciate that you entrust me with your sleep related healthcare concerns. I hope, I was able to address at least some of your concerns today, and that I can help you feel reassured and also get better.    Here is what we discussed today and what we came up with as our plan for you:    Based on your symptoms and your exam I believe you are at risk for obstructive sleep apnea (aka OSA), and I think we should proceed with a sleep study to determine whether you do or do not have OSA and how severe it is. Even, if you have mild OSA, I may want you to consider treatment with CPAP, as treatment of even borderline or mild sleep apnea can result and improvement of symptoms such as sleep disruption, daytime sleepiness, nighttime bathroom breaks, restless leg symptoms, improvement of headache syndromes, even improved mood disorder.   We will proceed with a home sleep test as per your preference.  Please remember, the long-term risks and ramifications of untreated moderate to severe obstructive sleep apnea are: increased Cardiovascular disease, including congestive heart failure, stroke, difficult to control hypertension, treatment resistant obesity, arrhythmias, especially irregular heartbeat commonly known as A. Fib. (atrial fibrillation); even type 2 diabetes has been linked to untreated OSA.   Sleep apnea can cause disruption of sleep and sleep deprivation in most cases, which, in turn, can cause recurrent headaches, problems with memory, mood, concentration, focus, and vigilance. Most people with untreated sleep apnea report excessive daytime sleepiness, which can affect their ability to drive. Please do not drive if you feel sleepy. Patients with sleep apnea developed difficulty initiating and maintaining sleep (aka insomnia).   Having sleep apnea may increase your risk for other sleep  disorders, including involuntary behaviors sleep such as sleep terrors, sleep talking, sleepwalking.    Having sleep apnea can also increase your risk for restless leg syndrome and leg movements at night.   Please note that untreated obstructive sleep apnea may carry additional perioperative morbidity. Patients with significant obstructive sleep apnea (typically, in the moderate to severe degree) should receive, if possible, perioperative PAP (positive airway pressure) therapy and the surgeons and particularly the anesthesiologists should be informed of the diagnosis and the severity of the sleep disordered breathing.   I will likely see you back after your sleep study to go over the test results and where to go from there. We will call you after your sleep study to advise about the results (most likely, you will hear from Rendon, my nurse) and to set up an appointment at the time, as necessary.    Our sleep lab administrative assistant will call you to schedule your sleep study and give you further instructions, regarding the check in process for the sleep study, arrival time, what to bring, when you can expect to leave after the study, etc., and to answer any other logistical questions you may have. If you don't hear back from her by about 2 weeks from now, please feel free to call her direct line at [phone] or you can call our general clinic number, or email [place] through My Chart.

## 2018-11-10 ENCOUNTER — Ambulatory Visit (INDEPENDENT_AMBULATORY_CARE_PROVIDER_SITE_OTHER): Payer: 59 | Admitting: Neurology

## 2018-11-10 DIAGNOSIS — Z820 Family history of epilepsy and other diseases of the nervous system: Secondary | ICD-10-CM

## 2018-11-10 DIAGNOSIS — G4733 Obstructive sleep apnea (adult) (pediatric): Secondary | ICD-10-CM

## 2018-11-10 DIAGNOSIS — G4719 Other hypersomnia: Secondary | ICD-10-CM

## 2018-11-10 DIAGNOSIS — Z6841 Body Mass Index (BMI) 40.0 and over, adult: Secondary | ICD-10-CM

## 2018-11-10 DIAGNOSIS — R519 Headache, unspecified: Secondary | ICD-10-CM

## 2018-11-10 DIAGNOSIS — R0681 Apnea, not elsewhere classified: Secondary | ICD-10-CM

## 2018-11-10 DIAGNOSIS — R0683 Snoring: Secondary | ICD-10-CM

## 2018-11-13 ENCOUNTER — Ambulatory Visit (INDEPENDENT_AMBULATORY_CARE_PROVIDER_SITE_OTHER): Payer: 59 | Admitting: Family Medicine

## 2018-11-13 ENCOUNTER — Encounter: Payer: Self-pay | Admitting: Family Medicine

## 2018-11-13 ENCOUNTER — Other Ambulatory Visit (HOSPITAL_COMMUNITY)
Admission: RE | Admit: 2018-11-13 | Discharge: 2018-11-13 | Disposition: A | Discharge status: Home / Self Care | Payer: 59 | Source: Ambulatory Visit | Attending: Family Medicine | Admitting: Family Medicine

## 2018-11-13 ENCOUNTER — Other Ambulatory Visit: Payer: Self-pay

## 2018-11-13 VITALS — BP 134/86 | HR 80 | Temp 98.3°F | Ht 64.5 in | Wt 272.0 lb

## 2018-11-13 DIAGNOSIS — Z01419 Encounter for gynecological examination (general) (routine) without abnormal findings: Secondary | ICD-10-CM | POA: Diagnosis not present

## 2018-11-13 DIAGNOSIS — Z1231 Encounter for screening mammogram for malignant neoplasm of breast: Secondary | ICD-10-CM

## 2018-11-13 DIAGNOSIS — Z23 Encounter for immunization: Secondary | ICD-10-CM

## 2018-11-13 DIAGNOSIS — Z131 Encounter for screening for diabetes mellitus: Secondary | ICD-10-CM | POA: Diagnosis not present

## 2018-11-13 DIAGNOSIS — Z13220 Encounter for screening for lipoid disorders: Secondary | ICD-10-CM | POA: Diagnosis not present

## 2018-11-13 MED ORDER — FAMOTIDINE 20 MG PO TABS: 20.0000 mg | ORAL_TABLET | Freq: Two times a day (BID) | ORAL | 5 refills | Status: AC

## 2018-11-13 NOTE — Procedures (Signed)
Patient Information     First Name: Cynthia Last Name: Fitzgerald ID: [id number]  Birth Date: [birth year] Age: 63 Gender: Female  Referring Provider: Rutherford Guys, MD BMI: 46.6 (W=280 lb, H=5' 5'')  Neck Circ.:  17 '' Epworth:  14/24   Sleep Study Information    Study Date: Nov 10, 2018 S/H/A Version: 001.001.001.001 / 4.1.1528 / 16  History:    63 year old woman with a history of hyperlipidemia, headaches, history of cataracts, and morbid obesity, who reports snoring and recent episodes of palpitation and shortness of breath. Her husband has noted pauses and snorting sounds while she is asleep. Summary & Diagnosis:     Severe OSA   Recommendations:      This home sleep test demonstrates severe obstructive sleep apnea with a total AHI of 49.4/hour and O2 nadir in the 50s (I could not verify the nadir of 51% per technical report). Given the patient's medical history and sleep related complaints, treatment with positive airway pressure (in the form of CPAP) is recommended. This will require a full night CPAP titration study for proper treatment settings, O2 monitoring and mask fitting. Based on the severity of the sleep disordered breathing an attended titration study is indicated. However, patient's insurance has denied an attended sleep study; therefore, the patient will be advised to proceed with an autoPAP titration/trial at home for now. Please note that untreated obstructive sleep apnea may carry additional perioperative morbidity. Patients with significant obstructive sleep apnea should receive perioperative PAP therapy and the surgeons and particularly the anesthesiologist should be informed of the diagnosis and the severity of the sleep disordered breathing. The patient should be cautioned not to drive, work at heights, or operate dangerous or heavy equipment when tired or sleepy. Review and reiteration of good sleep hygiene measures should be pursued with any patient. Other causes of the  patient's symptoms, including circadian rhythm disturbances, an underlying mood disorder, medication effect and/or an underlying medical problem cannot be ruled out based on this test. Clinical correlation is recommended. The patient and his referring provider will be notified of the test results. The patient will be seen in follow up in sleep clinic at [facility].  I certify that I have reviewed the raw data recording prior to the issuance of this report in accordance with the standards of the American Academy of Sleep Medicine (AASM).  Star Age, MD, PhD [facility]) Diplomat, ABPN (Neurology and Sleep)           Sleep Summary    Oxygen Saturation Statistics     Start Study Time: End Study Time: Total Recording Time:  10:02:15 PM   5:23:32 AM   7 h, 21 min  Total Sleep Time % REM of Sleep Time:  6 h, 38 min  23.6    Mean: 91 Minimum: 51 Maximum: 99  Mean of Desaturations Nadirs (%):   87  Oxygen Desaturation. %:   4-9 10-20 >20 Total  Events Number Total   184  31 76.0 12.8  27 11.2  242 100.0  Oxygen Saturation: <90 <=88 <85 <80 <70  Duration (minutes): Sleep % 51.7 13.0 42.6 25.5 10.7 6.4 18.3 4.6 4.8 1.2     Respiratory Indices      Total Events REM NREM All Night  pRDI:  326  pAHI:  326 ODI:  242  pAHIc:  13  % CSR: 0.0 63.4 63.4 60.1 3.9 45.2 45.2 29.5 1.4 49.4 49.4 36.7 2.0  Pulse Rate Statistics during Sleep (BPM)      Mean: 77 Minimum: 52 Maximum: 111    Indices are calculated using technically valid sleep time of  6 hrs, 35 min. pRDI/pAHI are calculated using oxi desaturations ? 3%  Body Position Statistics  Position Supine Prone Right Left Non-Supine  Sleep (min) 178.0 220.3 0.0 0.0 220.3  Sleep % 44.7 55.3 0.0 0.0 55.3  pRDI 48.5 50.2 N/A N/A 50.2  pAHI 48.5 50.2 N/A N/A 50.2  ODI 37.3 36.2 N/A N/A 36.2     Snoring Statistics Snoring Level (dB) >40 >50 >60 >70 >80 >Threshold (45)  Sleep (min)  332.9 209.3 10.5 0.0 0.0 267.5  Sleep % 83.6 52.5 2.6 0.0 0.0 67.2    Mean: 50 dB Sleep Stages Chart                                                                           pAHI=49.4                                                                                       Mild              Moderate                    Severe                                                 5              15                    30

## 2018-11-13 NOTE — Addendum Note (Signed)
Addended by: Star Age on: 11/13/2018 07:16 PM   Modules accepted: Orders

## 2018-11-13 NOTE — Patient Instructions (Addendum)
return in 2 months for shingrix #2   Preventive Care 62-64 Years Old, Female Preventive care refers to visits with your health care provider and lifestyle choices that can promote health and wellness. This includes:  A yearly physical exam. This may also be called an annual well check.  Regular dental visits and eye exams.  Immunizations.  Screening for certain conditions.  Healthy lifestyle choices, such as eating a healthy diet, getting regular exercise, not using drugs or products that contain nicotine and tobacco, and limiting alcohol use. What can I expect for my preventive care visit? Physical exam Your health care provider will check your:  Height and weight. This may be used to calculate body mass index (BMI), which tells if you are at a healthy weight.  Heart rate and blood pressure.  Skin for abnormal spots. Counseling Your health care provider may ask you questions about your:  Alcohol, tobacco, and drug use.  Emotional well-being.  Home and relationship well-being.  Sexual activity.  Eating habits.  Work and work Statistician.  Method of birth control.  Menstrual cycle.  Pregnancy history. What immunizations do I need?  Influenza (flu) vaccine  This is recommended every year. Tetanus, diphtheria, and pertussis (Tdap) vaccine  You may need a Td booster every 10 years. Varicella (chickenpox) vaccine  You may need this if you have not been vaccinated. Zoster (shingles) vaccine  You may need this after age 49. Measles, mumps, and rubella (MMR) vaccine  You may need at least one dose of MMR if you were born in 1957 or later. You may also need a second dose. Pneumococcal conjugate (PCV13) vaccine  You may need this if you have certain conditions and were not previously vaccinated. Pneumococcal polysaccharide (PPSV23) vaccine  You may need one or two doses if you smoke cigarettes or if you have certain conditions. Meningococcal conjugate  (MenACWY) vaccine  You may need this if you have certain conditions. Hepatitis A vaccine  You may need this if you have certain conditions or if you travel or work in places where you may be exposed to hepatitis A. Hepatitis B vaccine  You may need this if you have certain conditions or if you travel or work in places where you may be exposed to hepatitis B. Haemophilus influenzae type b (Hib) vaccine  You may need this if you have certain conditions. Human papillomavirus (HPV) vaccine  If recommended by your health care provider, you may need three doses over 6 months. You may receive vaccines as individual doses or as more than one vaccine together in one shot (combination vaccines). Talk with your health care provider about the risks and benefits of combination vaccines. What tests do I need? Blood tests  Lipid and cholesterol levels. These may be checked every 5 years, or more frequently if you are over 21 years old.  Hepatitis C test.  Hepatitis B test. Screening  Lung cancer screening. You may have this screening every year starting at age 36 if you have a 30-pack-year history of smoking and currently smoke or have quit within the past 15 years.  Colorectal cancer screening. All adults should have this screening starting at age 54 and continuing until age 28. Your health care provider may recommend screening at age 69 if you are at increased risk. You will have tests every 1-10 years, depending on your results and the type of screening test.  Diabetes screening. This is done by checking your blood sugar (glucose) after you have  not eaten for a while (fasting). You may have this done every 1-3 years.  Mammogram. This may be done every 1-2 years. Talk with your health care provider about when you should start having regular mammograms. This may depend on whether you have a family history of breast cancer.  BRCA-related cancer screening. This may be done if you have a family  history of breast, ovarian, tubal, or peritoneal cancers.  Pelvic exam and Pap test. This may be done every 3 years starting at age 75. Starting at age 87, this may be done every 5 years if you have a Pap test in combination with an HPV test. Other tests  Sexually transmitted disease (STD) testing.  Bone density scan. This is done to screen for osteoporosis. You may have this scan if you are at high risk for osteoporosis. Follow these instructions at home: Eating and drinking  Eat a diet that includes fresh fruits and vegetables, whole grains, lean protein, and low-fat dairy.  Take vitamin and mineral supplements as recommended by your health care provider.  Do not drink alcohol if: ? Your health care provider tells you not to drink. ? You are pregnant, may be pregnant, or are planning to become pregnant.  If you drink alcohol: ? Limit how much you have to 0-1 drink a day. ? Be aware of how much alcohol is in your drink. In the [place] one drink equals one 12 oz bottle of beer (355 mL), one 5 oz glass of wine (148 mL), or one 1 oz glass of hard liquor (44 mL). Lifestyle  Take daily care of your teeth and gums.  Stay active. Exercise for at least 30 minutes on 5 or more days each week.  Do not use any products that contain nicotine or tobacco, such as cigarettes, e-cigarettes, and chewing tobacco. If you need help quitting, ask your health care provider.  If you are sexually active, practice safe sex. Use a condom or other form of birth control (contraception) in order to prevent pregnancy and STIs (sexually transmitted infections).  If told by your health care provider, take low-dose aspirin daily starting at age 33. What's next?  Visit your health care provider once a year for a well check visit.  Ask your health care provider how often you should have your eyes and teeth checked.  Stay up to date on all vaccines. This information is not intended to replace advice given to you  by your health care provider. Make sure you discuss any questions you have with your health care provider. Document Released: 04/01/2015 Document Revised: 11/14/2017 Document Reviewed: 11/14/2017 Elsevier Patient Education  [employer].   If you have lab work done today you will be contacted with your lab results within the next 2 weeks.  If you have not heard from [place] then please contact us. The fastest way to get your results is to register for My Chart.   IF you received an x-ray today, you will receive an invoice from [facility]. Please contact [facility] at [phone] with questions or concerns regarding your invoice.   IF you received labwork today, you will receive an invoice from [facility]. Please contact [facility] at [phone] with questions or concerns regarding your invoice.   Our billing staff will not be able to assist you with questions regarding bills from these companies.  You will be contacted with the lab results as soon as they are available. The fastest way to get your results is to  activate your My Chart account. Instructions are located on the last page of this paperwork. If you have not heard from [place] regarding the results in 2 weeks, please contact this office.

## 2018-11-13 NOTE — Progress Notes (Signed)
[id number] PM  Cynthia Fitzgerald [id number], 63 y.o., female [id number]  Chief Complaint  Patient presents with  . Annual Exam  . Gynecologic Exam    HPI:   Patient is a 63 y.o. female who presents today for WWE  Cervical Cancer Screening: due today, denies h/o abnormal Breast Cancer Screening: 2019, denies abnormal, mother and sister had breast cancer Colorectal Cancer Screening: 2017, Dr Collene Mares, repeat in 5 years Bone Density Testing: at age 61 Seasonal Influenza Vaccination: gets at work Td/Tdap Vaccination: 2012 Pneumococcal Vaccination: at age 4 Zoster Vaccination: due today Frequency of Dental evaluation: Q6 months Frequency of Eye evaluation: yearly   Hearing Screening   125Hz  250Hz  500Hz  1000Hz  2000Hz  3000Hz  4000Hz  6000Hz  8000Hz   Right ear:           Left ear:             Visual Acuity Screening   Right eye Left eye Both eyes  Without correction: 20/25 20/25 20/25   With correction:       Depression screen [facility] 2/9 11/13/2018 10/09/2018 12/21/2017  Decreased Interest 0 0 0  Down, Depressed, Hopeless 0 0 0  PHQ - 2 Score 0 0 0    Fall Risk  11/13/2018 10/09/2018 12/21/2017 12/13/2016 05/17/2016  Falls in the past year? 0 0 No No No  Number falls in past yr: 0 0 - - -  Injury with Fall? 0 - - - -  Follow up - Falls evaluation completed - - -     Allergies  Allergen Reactions  . Erythromycin Nausea Only    Prior to Admission medications   Medication Sig Start Date End Date Taking? Authorizing Provider  IBUPROFEN PO Take by mouth.   Yes [provider]  Magnesium 250 MG TABS Take 1 tablet by mouth daily.   Yes [provider]  Multiple Vitamin (MULTIVITAMIN) tablet Take 1 tablet by mouth daily.   Yes [provider]  ranitidine (ZANTAC) 150 MG tablet Take 1 tablet (150 mg total) by mouth 2 (two) times daily. 12/13/16  Yes Jaynee Eagles, PA-C  triamcinolone cream (KENALOG) 0.1 % Apply 1 application topically 2 (two) times daily. 12/13/16   Yes Jaynee Eagles, PA-C    Past Medical History:  Diagnosis Date  . Cataract   . Headache(784.0)   . Hyperlipidemia   . Morbid obesity ([place])     Past Surgical History:  Procedure Laterality Date  . BREAST SURGERY     Breast reduction  . CESAREAN SECTION    . EYE SURGERY Right 11/13/11   cataract    Social History   Tobacco Use  . Smoking status: Never Smoker  . Smokeless tobacco: Never Used  Substance Use Topics  . Alcohol use: Yes    Alcohol/week: 2.0 standard drinks    Types: 2 Standard drinks or equivalent per week    Comment: few drinks per week    Family History  Problem Relation Age of Onset  . Hyperlipidemia Mother   . Cancer Mother        breast cancer  . Hypertension Father   . Diabetes Father   . Hyperlipidemia Father   . Cancer Father        prostate  . Cancer Maternal Grandmother        uterine and cervical  . Diabetes Paternal Grandmother   . Cancer Paternal Grandfather        Esophageal and lung cancer  . Cancer Sister  breast cancer  . Hyperlipidemia Sister   . Diabetes Brother   . Hypertension Brother   . Hyperlipidemia Brother   . Hyperlipidemia Sister     Review of Systems  Constitutional: Negative for chills and fever.  Respiratory: Negative for cough and shortness of breath.   Cardiovascular: Negative for chest pain, palpitations and leg swelling.  Gastrointestinal: Positive for heartburn. Negative for abdominal pain, melena, nausea and vomiting.  All other systems reviewed and are negative.    OBJECTIVE:  Today's Vitals   11/13/18 1351  BP: 134/86  Pulse: 80  Temp: 98.3 F (36.8 C)  SpO2: 95%  Weight: 272 lb (123.4 kg)  Height: 5' 4.5" (1.638 m)   Body mass index is 45.97 kg/m.   Physical Exam Vitals signs and nursing note reviewed. Exam conducted with a chaperone present.  Constitutional:      Appearance: She is well-developed.  HENT:     Head: Normocephalic and atraumatic.     Right Ear: Hearing, tympanic  membrane, ear canal and external ear normal.     Left Ear: Hearing, tympanic membrane, ear canal and external ear normal.  Eyes:     Conjunctiva/sclera: Conjunctivae normal.     Pupils: Pupils are equal, round, and reactive to light.  Neck:     Musculoskeletal: Neck supple.     Thyroid: No thyromegaly.  Cardiovascular:     Rate and Rhythm: Normal rate and regular rhythm.     Heart sounds: Normal heart sounds. No murmur. No friction rub. No gallop.   Pulmonary:     Effort: Pulmonary effort is normal.     Breath sounds: Normal breath sounds. No wheezing, rhonchi or rales.  Chest:     Breasts: Breasts are symmetrical.        Right: No inverted nipple, mass, nipple discharge, skin change or tenderness.        Left: No inverted nipple, mass, nipple discharge, skin change or tenderness.  Abdominal:     General: Bowel sounds are normal. There is no distension.     Palpations: Abdomen is soft.     Tenderness: There is no abdominal tenderness. There is no guarding.  Genitourinary:    Labia:        Right: No rash or lesion.        Left: No rash or lesion.      Vagina: No vaginal discharge or erythema.     Cervix: No cervical motion tenderness, discharge or friability.     Uterus: Not enlarged, not fixed and not tender.      Adnexa:        Right: No mass or tenderness.         Left: No mass or tenderness.    Musculoskeletal: Normal range of motion.  Lymphadenopathy:     Cervical: No cervical adenopathy.     Upper Body:     Right upper body: No supraclavicular adenopathy.     Left upper body: No supraclavicular adenopathy.  Skin:    General: Skin is warm and dry.  Neurological:     Mental Status: She is alert and oriented to person, place, and time.     Deep Tendon Reflexes: Reflexes are normal and symmetric.     No results found for this or any previous visit (from the past 24 hour(s)).  No results found.   ASSESSMENT and PLAN  1. Women's annual routine gynecological  examination Routine HCM labs ordered. HCM reviewed/discussed. Anticipatory guidance regarding healthy weight, lifestyle  and choices given.   - Cytology - PAP([facility])  2. Screening for lipid disorders - Lipid panel  3. Screening for diabetes mellitus - Hemoglobin A1c  4. Visit for screening mammogram - MM DIGITAL SCREENING BILATERAL; Future  Other orders - Varicella-zoster vaccine IM (Shingrix) - famotidine (PEPCID) 20 MG tablet; Take 1 tablet (20 mg total) by mouth 2 (two) times daily.  Return in about 1 year (around 11/13/2019).    Rutherford Guys, MD [facility] at [facility] [place], [place] [address] Ph.  [phone] Fax [phone]

## 2018-11-13 NOTE — Progress Notes (Signed)
Patient referred by Dr. Pamella Pert, seen by me on 10/21/18, HST on 11/10/18.    Please call and notify the patient that the recent home sleep test showed obstructive sleep apnea in the severe range. While I recommend treatment for this in the form CPAP, her insurance will not approve a sleep study for this. They will likely only approve a trial of autoPAP, which means, that we don't have to bring her in for a sleep study with CPAP, but will let her start using a so called autoPAP machine at home, through a DME company (of her choice, or as per insurance requirement). The DME representative will educate her on how to use the machine, how to put the mask on, etc. I have placed an order in the chart. Please send referral, talk to patient, send report to referring MD. We will need a FU in sleep clinic for 10 weeks post-PAP set up, please arrange that with me or one of our NPs. Thanks,   Star Age, MD, PhD [facility])

## 2018-11-14 LAB — LIPID PANEL
Chol/HDL Ratio: 5.8 ratio — ABNORMAL HIGH (ref 0.0–4.4)
Cholesterol, Total: 263 mg/dL — ABNORMAL HIGH (ref 100–199)
HDL: 45 mg/dL (ref >39)
LDL Calculated: 174 mg/dL — ABNORMAL HIGH (ref 0–99)
Triglycerides: 222 mg/dL — ABNORMAL HIGH (ref 0–149)
VLDL Cholesterol Cal: 44 mg/dL — ABNORMAL HIGH (ref 5–40)

## 2018-11-14 LAB — HEMOGLOBIN A1C
Est. average glucose Bld gHb Est-mCnc: 140 mg/dL
Hgb A1c MFr Bld: 6.5 % — ABNORMAL HIGH (ref 4.8–5.6)

## 2018-11-17 ENCOUNTER — Telehealth: Payer: Self-pay

## 2018-11-17 NOTE — Telephone Encounter (Signed)
I called pt. I advised pt that Dr. Rexene Alberts reviewed their sleep study results and found that pt has severe osa. Dr. Rexene Alberts recommends that pt start an auto pap at home. I reviewed PAP compliance expectations with the pt. Pt is agreeable to starting an auto-PAP. I advised pt that an order will be sent to a DME, [facility], and [facility] will call the pt within about one week after they file with the pt's insurance. [facility] will show the pt how to use the machine, fit for masks, and troubleshoot the auto-PAP if needed. A follow up appt was made for insurance purposes with Dr. Rexene Alberts on 01/15/19 at 1:00pm. Pt verbalized understanding to arrive 15 minutes early and bring their auto-PAP. A letter with all of this information in it will be mailed to the pt as a reminder. I verified with the pt that the address we have on file is correct. Pt verbalized understanding of results. Pt had no questions at this time but was encouraged to call back if questions arise. I have sent the order to [facility] and have received confirmation that they have received the order.

## 2018-11-17 NOTE — Telephone Encounter (Signed)
-----   Message from Star Age, MD sent at 11/13/2018  7:16 PM EDT ----- Patient referred by Dr. Pamella Pert, seen by me on 10/21/18, HST on 11/10/18.    Please call and notify the patient that the recent home sleep test showed obstructive sleep apnea in the severe range. While I recommend treatment for this in the form CPAP, her insurance will not approve a sleep study for this. They will likely only approve a trial of autoPAP, which means, that we don't have to bring her in for a sleep study with CPAP, but will let her start using a so called autoPAP machine at home, through a DME company (of her choice, or as per insurance requirement). The DME representative will educate her on how to use the machine, how to put the mask on, etc. I have placed an order in the chart. Please send referral, talk to patient, send report to referring MD. We will need a FU in sleep clinic for 10 weeks post-PAP set up, please arrange that with me or one of our NPs. Thanks,   Star Age, MD, PhD [facility])

## 2018-11-18 LAB — CYTOLOGY - PAP: Diagnosis: NEGATIVE

## 2018-12-01 ENCOUNTER — Encounter: Payer: Self-pay | Admitting: Family Medicine

## 2018-12-01 DIAGNOSIS — E119 Type 2 diabetes mellitus without complications: Secondary | ICD-10-CM | POA: Insufficient documentation

## 2018-12-01 HISTORY — DX: Type 2 diabetes mellitus without complications: E11.9

## 2019-01-15 ENCOUNTER — Ambulatory Visit: Payer: Self-pay | Admitting: Neurology

## 2019-01-27 NOTE — Telephone Encounter (Signed)
Received this notice from [facility]: "This patient, Cynthia Fitzgerald called back and first said she was waiting to hear back from someone and never did so she was not aware of her appointment but that she was not going to get the machine anyways and to take her off the call list. I am voiding this order, just wanted to make you all aware."  I called pt to discuss. No answer, left a message asking her to call me back.

## 2019-02-02 LAB — HM MAMMOGRAPHY

## 2019-03-06 ENCOUNTER — Encounter (HOSPITAL_COMMUNITY): Payer: Self-pay

## 2019-03-06 ENCOUNTER — Ambulatory Visit (HOSPITAL_COMMUNITY)
Admission: EM | Admit: 2019-03-06 | Discharge: 2019-03-06 | Disposition: A | Discharge status: Home / Self Care | Payer: 59 | Attending: Emergency Medicine | Admitting: Emergency Medicine

## 2019-03-06 ENCOUNTER — Ambulatory Visit: Payer: Self-pay

## 2019-03-06 ENCOUNTER — Ambulatory Visit (INDEPENDENT_AMBULATORY_CARE_PROVIDER_SITE_OTHER): Payer: 59

## 2019-03-06 ENCOUNTER — Other Ambulatory Visit: Payer: Self-pay

## 2019-03-06 DIAGNOSIS — R509 Fever, unspecified: Secondary | ICD-10-CM | POA: Diagnosis not present

## 2019-03-06 DIAGNOSIS — J189 Pneumonia, unspecified organism: Secondary | ICD-10-CM

## 2019-03-06 DIAGNOSIS — R0602 Shortness of breath: Secondary | ICD-10-CM

## 2019-03-06 DIAGNOSIS — R05 Cough: Secondary | ICD-10-CM | POA: Diagnosis not present

## 2019-03-06 DIAGNOSIS — U071 COVID-19: Secondary | ICD-10-CM

## 2019-03-06 MED ORDER — DEXAMETHASONE SODIUM PHOSPHATE 10 MG/ML IJ SOLN
10.0000 mg | Freq: Once | INTRAMUSCULAR | Status: AC
  Administered 2019-03-06: 12:00:00 10 mg via INTRAMUSCULAR

## 2019-03-06 MED ORDER — DEXAMETHASONE SODIUM PHOSPHATE 10 MG/ML IJ SOLN
INTRAMUSCULAR | Status: AC
  Filled 2019-03-06: qty 1

## 2019-03-06 NOTE — ED Triage Notes (Addendum)
Patient presents to Urgent Care with complaints of fever, shortness of breath, cough since the past few nights. Patient reports she was sent here for a chest x-ray by her pcp.  Pt tested positive for covid a week ago.

## 2019-03-06 NOTE — Discharge Instructions (Signed)
Your chest xray looks overall well, there is some inflammatory changes we can see with viral infections.  I am hopeful the steroid is helpful with that.  Continue with over the counter medications as needed for symptoms.  Rest. Tylenol and/or ibuprofen as needed for pain or fevers.   Push fluids to ensure adequate hydration and keep secretions thin.  If worsening of shortness of breath, difficulty breathing, chest pain, or otherwise worsening please go to the ER.

## 2019-03-06 NOTE — Telephone Encounter (Signed)
Incoming call from Patient with complaint of Sob and fever.  Patient states that for the past four nights she ran a temperature .  Patient states that she ran a temperature.  Last night it was 101.8 .  Patient is covid-19 positive.  Reports Reports chest  pain when inhaling. .  Worst at night.  Has a Hx of bronchitis.  Coughing up yellow mucous.   Rates the pain from mild to moderate.  Denies cardiac Hx.  Reports Cough, headache nausea.  Reviewed protocol with Patient.  Recommended Patient go to Urgent care or ED.  For further assessment. Voiced understanding.            Reason for Disposition . [1] Fever > 101 F (38.3 C) AND [2] age > 21  Answer Assessment - Initial Assessment Questions 1. RESPIRATORY STATUS: "Describe your breathing?" (e.g., wheezing, shortness of breath, unable to speak, severe coughing)      Shortness of breath when inhaling  2. ONSET: "When did this breathing problem begin?"       Last four nights3. PATTERN "Does the difficult breathing come and go, or has it been constant since it started?"       4. SEVERITY: "How bad is your breathing?" (e.g., mild, moderate, severe)    - MILD: No SOB at rest, mild SOB with walking, speaks normally in sentences, can lay down, no retractions, pulse < 100.    - MODERATE: SOB at rest, SOB with minimal exertion and prefers to sit, cannot lie down flat, speaks in phrases, mild retractions, audible wheezing, pulse 100-120.    - SEVERE: Very SOB at rest, speaks in single words, struggling to breathe, sitting hunched forward, retractions, pulse > 120      Mild to moderate 5. RECURRENT SYMPTOM: "Have you had difficulty breathing before?" If so, ask: "When was the last time?" and "What happened that time?"       6. CARDIAC HISTORY: "Do you have any history of heart disease?" (e.g., heart attack, angina, bypass surgery, angioplasty)      denies 7. LUNG HISTORY: "Do you have any history of lung disease?"  (e.g., pulmonary embolus, asthma,  emphysema)    Hx bronchitis 8. CAUSE: "What do you think is causing the breathing problem?"       9. OTHER SYMPTOMS: "Do you have any other symptoms? (e.g., dizziness, runny nose, cough, chest pain, fever)     Cough, headache,  nausea  10. PREGNANCY: "Is there any chance you are pregnant?" "When was your last menstrual period?"       na 11. TRAVEL: "Have you traveled out of the country in the last month?" (e.g., travel history, exposures)       *No Answer*  Protocols used: BREATHING DIFFICULTY-A-AH

## 2019-03-06 NOTE — Telephone Encounter (Signed)
Pt was instructed to go to ED or urgent care for further assessment by Alan Ripper.. Pt is COVID positive. Pk/CMA

## 2019-03-06 NOTE — ED Provider Notes (Signed)
[facility]    CSN: [id number] Arrival date & time: 03/06/19  1055      History   Chief Complaint Chief Complaint  Patient presents with  . Fever    COVID +  . Shortness of Breath    HPI Cynthia Fitzgerald is a 63 y.o. female.   Cynthia Fitzgerald presents with complaints of cough and intermittent chest pain associated with cough, some shortness of breath. Symptoms initially started 8 days ago, and then tested positive for covid-19 12/13. Has had fevers, headache, body aches, nausea, diarrhea, some loss of taste/smell. Has had higher fevers at night over the past few nights, around 101, and has had some increased pain and coughing. Has been taking over the counter medications including ibuprofen which have helped. Doesn't smoke, no history of asthma. History of dm, hyperlipidemia, obesity. States has had issues with bronchitis in the past.     ROS per HPI, negative if not otherwise mentioned.      Past Medical History:  Diagnosis Date  . Cataract   . Diabetes mellitus type 2, controlled ([place]) 12/01/2018   A1c 6.5  . Headache(784.0)   . Hyperlipidemia   . Morbid obesity [facility])     Patient Active Problem List   Diagnosis Date Noted  . Diabetes mellitus type 2, controlled ([place]) 12/01/2018  . Chest pain, atypical 10/22/2011  . Hyperlipidemia 10/22/2011  . Headache disorder 10/22/2011  . Obesity, Class III, BMI 40-49.9 (morbid obesity) ([place]) 10/22/2011  . Family history of breast cancer in first degree relative 10/22/2011  . Family history of uterine cancer 10/22/2011  . Family history of prostate cancer 10/22/2011  . Family history of Alzheimer's disease 10/22/2011    Past Surgical History:  Procedure Laterality Date  . BREAST SURGERY     Breast reduction  . CESAREAN SECTION    . EYE SURGERY Right 11/13/11   cataract    OB History   No obstetric history on file.      Home Medications    Prior to Admission medications   Medication Sig Start Date End  Date Taking? Authorizing Provider  famotidine (PEPCID) 20 MG tablet Take 1 tablet (20 mg total) by mouth 2 (two) times daily. 11/13/18   Rutherford Guys, MD  IBUPROFEN PO Take by mouth.    [provider]  Magnesium 250 MG TABS Take 1 tablet by mouth daily.    [provider]  Multiple Vitamin (MULTIVITAMIN) tablet Take 1 tablet by mouth daily.    [provider]  triamcinolone cream (KENALOG) 0.1 % Apply 1 application topically 2 (two) times daily. 12/13/16   Jaynee Eagles, PA-C    Family History Family History  Problem Relation Age of Onset  . Hyperlipidemia Mother   . Cancer Mother        breast cancer  . Hypertension Father   . Diabetes Father   . Hyperlipidemia Father   . Cancer Father        prostate  . Cancer Maternal Grandmother        uterine and cervical  . Diabetes Paternal Grandmother   . Cancer Paternal Grandfather        Esophageal and lung cancer  . Cancer Sister        breast cancer  . Hyperlipidemia Sister   . Diabetes Brother   . Hypertension Brother   . Hyperlipidemia Brother   . Hyperlipidemia Sister     Social History Social History   Tobacco Use  .  Smoking status: Never Smoker  . Smokeless tobacco: Never Used  Substance Use Topics  . Alcohol use: Yes    Alcohol/week: 2.0 standard drinks    Types: 2 Standard drinks or equivalent per week    Comment: few drinks per week  . Drug use: Never     Allergies   Erythromycin   Review of Systems Review of Systems   Physical Exam Triage Vital Signs ED Triage Vitals  Enc Vitals Group     BP 03/06/19 1110 135/82     Pulse Rate 03/06/19 1110 95     Resp 03/06/19 1110 17     Temp 03/06/19 1110 98.9 F (37.2 C)     Temp Source 03/06/19 1110 Oral     SpO2 03/06/19 1110 95 %     Weight --      Height --      Head Circumference --      Peak Flow --      Pain Score 03/06/19 1108 4     Pain Loc --      Pain Edu? --      Excl. in [place]? --    No data found.  Updated  Vital Signs BP 135/82 (BP Location: Left Arm)   Pulse 95   Temp 98.9 F (37.2 C) (Oral)   Resp 17   SpO2 95%   Visual Acuity Right Eye Distance:   Left Eye Distance:   Bilateral Distance:    Right Eye Near:   Left Eye Near:    Bilateral Near:     Physical Exam Constitutional:      General: She is not in acute distress.    Appearance: She is well-developed.  Cardiovascular:     Rate and Rhythm: Normal rate.  Pulmonary:     Effort: Pulmonary effort is normal.     Comments: Speaking clearly without difficulty, no cough throughout exam Skin:    General: Skin is warm and dry.  Neurological:     Mental Status: She is alert and oriented to person, place, and time.      UC Treatments / Results  Labs (all labs ordered are listed, but only abnormal results are displayed) Labs Reviewed - No data to display  EKG   Radiology DG Chest 2 View  Result Date: 03/06/2019 CLINICAL DATA:  COVID positive.  Shortness of breath. EXAM: CHEST - 2 VIEW COMPARISON:  10/09/2018. FINDINGS: Mediastinum hilar structures normal. Heart size stable. Bilateral interstitial prominence noted consistent pneumonitis. No pleural effusion or pneumothorax. IMPRESSION: Bilateral interstitial prominence noted. This consistent pneumonitis in this known COVID positive patient. Electronically Signed   By: Marcello Moores  Register   On: 03/06/2019 11:25    Procedures Procedures (including critical care time)  Medications Ordered in UC Medications  dexamethasone (DECADRON) injection 10 mg (10 mg Intramuscular Given 03/06/19 1204)    Initial Impression / Assessment and Plan / UC Course  I have reviewed the triage vital signs and the nursing notes.  Pertinent labs & imaging results that were available during my care of the patient were reviewed by me and considered in my medical decision making (see chart for details).     Non toxic. Benign physical exam.  Afebrile currently. No increased work of breathing. No  hypoxia. Speaking clearly. Chest xray with pneumonitis. Decadron provided today. Continue with supportive cares. Return precautions provided. Patient verbalized understanding and agreeable to plan.   Final Clinical Impressions(s) / UC Diagnoses   Final diagnoses:  COVID-19 virus infection  Discharge Instructions     Your chest xray looks overall well, there is some inflammatory changes we can see with viral infections.  I am hopeful the steroid is helpful with that.  Continue with over the counter medications as needed for symptoms.  Rest. Tylenol and/or ibuprofen as needed for pain or fevers.   Push fluids to ensure adequate hydration and keep secretions thin.  If worsening of shortness of breath, difficulty breathing, chest pain, or otherwise worsening please go to the ER.    ED Prescriptions    None     PDMP not reviewed this encounter.   Zigmund Gottron, NP 03/06/19 [phone]

## 2019-03-09 ENCOUNTER — Telehealth: Payer: Self-pay | Admitting: Family Medicine

## 2019-03-09 ENCOUNTER — Telehealth (INDEPENDENT_AMBULATORY_CARE_PROVIDER_SITE_OTHER): Payer: 59 | Admitting: Family Medicine

## 2019-03-09 ENCOUNTER — Other Ambulatory Visit: Payer: Self-pay

## 2019-03-09 DIAGNOSIS — U071 COVID-19: Secondary | ICD-10-CM

## 2019-03-09 MED ORDER — BUTALBITAL-APAP-CAFFEINE 50-325-40 MG PO TABS: 1.0000 | ORAL_TABLET | Freq: Four times a day (QID) | ORAL | 0 refills | Status: AC | PRN

## 2019-03-09 MED ORDER — ONDANSETRON HCL 8 MG PO TABS: 8.0000 mg | ORAL_TABLET | Freq: Three times a day (TID) | ORAL | 0 refills | Status: DC | PRN

## 2019-03-09 MED ORDER — ALBUTEROL SULFATE HFA 108 (90 BASE) MCG/ACT IN AERS: 2.0000 | INHALATION_SPRAY | Freq: Four times a day (QID) | RESPIRATORY_TRACT | 0 refills | Status: DC | PRN

## 2019-03-09 NOTE — Progress Notes (Signed)
Virtual Visit Note  I connected with patient on 03/09/19 at 620pm by phone and verified that I am speaking with the correct person using two identifiers. Cynthia Fitzgerald is currently located at home and patient is currently with them during visit. The provider, Rutherford Guys, MD is located in their office at time of visit.  I discussed the limitations, risks, security and privacy concerns of performing an evaluation and management service by telephone and the availability of in person appointments. I also discussed with the patient that there may be a patient responsible charge related to this service. The patient expressed understanding and agreed to proceed.   CC: covid 19 positive on dec 13th  HPI ? Patient tested positive for covid 19 on dec 13th Symptoms started 11 days ago Seen in ER 3 days ago - pneumonitis, decadron given Patient has been having lots of nausea, headaches, SOB Yesterday was a difficult day, today a bit better, gets worse at night Alternating ibuprofen and APAP  Had an old albuterol inhaler - helped some, requesting a new rx as hers is expired Pulse ox currently 91-92%  Allergies  Allergen Reactions  . Erythromycin Nausea Only    Prior to Admission medications   Medication Sig Start Date End Date Taking? Authorizing Provider  famotidine (PEPCID) 20 MG tablet Take 1 tablet (20 mg total) by mouth 2 (two) times daily. 11/13/18  Yes Rutherford Guys, MD  IBUPROFEN PO Take by mouth.   Yes [provider]  Magnesium 250 MG TABS Take 1 tablet by mouth daily.   Yes [provider]  Multiple Vitamin (MULTIVITAMIN) tablet Take 1 tablet by mouth daily.   Yes [provider]  triamcinolone cream (KENALOG) 0.1 % Apply 1 application topically 2 (two) times daily. 12/13/16  Yes Jaynee Eagles, PA-C    Past Medical History:  Diagnosis Date  . Cataract   . Diabetes mellitus type 2, controlled ([place]) 12/01/2018   A1c 6.5  . Headache(784.0)   .  Hyperlipidemia   . Morbid obesity ([place])     Past Surgical History:  Procedure Laterality Date  . BREAST SURGERY     Breast reduction  . CESAREAN SECTION    . EYE SURGERY Right 11/13/11   cataract    Social History   Tobacco Use  . Smoking status: Never Smoker  . Smokeless tobacco: Never Used  Substance Use Topics  . Alcohol use: Yes    Alcohol/week: 2.0 standard drinks    Types: 2 Standard drinks or equivalent per week    Comment: few drinks per week    Family History  Problem Relation Age of Onset  . Hyperlipidemia Mother   . Cancer Mother        breast cancer  . Hypertension Father   . Diabetes Father   . Hyperlipidemia Father   . Cancer Father        prostate  . Cancer Maternal Grandmother        uterine and cervical  . Diabetes Paternal Grandmother   . Cancer Paternal Grandfather        Esophageal and lung cancer  . Cancer Sister        breast cancer  . Hyperlipidemia Sister   . Diabetes Brother   . Hypertension Brother   . Hyperlipidemia Brother   . Hyperlipidemia Sister     ROS Per hpi  Objective  Vitals as reported by the patient: per above Gen: AAOx3, NAD Speaking in full sentences  ASSESSMENT and PLAN  1. COVID-19 virus detected Today felt better, continue with supportive measures, adding zofran, albuterol and fioricet for symptoms, RTC precautions given  Other orders - ondansetron (ZOFRAN) 8 MG tablet; Take 1 tablet (8 mg total) by mouth every 8 (eight) hours as needed for nausea or vomiting. - butalbital-acetaminophen-caffeine (FIORICET) 50-325-40 MG tablet; Take 1-2 tablets by mouth every 6 (six) hours as needed for headache. - albuterol (VENTOLIN HFA) 108 (90 Base) MCG/ACT inhaler; Inhale 2 puffs into the lungs every 6 (six) hours as needed for wheezing or shortness of breath.  FOLLOW-UP: prn   The above assessment and management plan was discussed with the patient. The patient verbalized understanding of and has agreed to the  management plan. Patient is aware to call the clinic if symptoms persist or worsen. Patient is aware when to return to the clinic for a follow-up visit. Patient educated on when it is appropriate to go to the emergency department.    I provided 14 minutes of non-face-to-face time during this encounter.  Rutherford Guys, MD Primary Care at [facility] [place], [place] [address] Ph.  [phone] Fax [phone]

## 2019-03-09 NOTE — Progress Notes (Signed)
Pt is positive for covid. Yesterday was a very rough day.Having sob, pain in the body, headache and major nausea. Today is better. Asking for a medication that can help with the nausea. Medication and pharmacy has been verified.

## 2019-03-09 NOTE — Telephone Encounter (Signed)
Pt has appt at 5:20 today 03/09/19  Would like to know if there is anything she could take in the mean time   Please advise

## 2019-04-03 ENCOUNTER — Other Ambulatory Visit: Payer: Self-pay

## 2019-04-03 ENCOUNTER — Telehealth: Payer: 59 | Admitting: Family Medicine

## 2019-04-03 DIAGNOSIS — E118 Type 2 diabetes mellitus with unspecified complications: Secondary | ICD-10-CM

## 2019-04-03 DIAGNOSIS — E785 Hyperlipidemia, unspecified: Secondary | ICD-10-CM

## 2019-04-24 ENCOUNTER — Ambulatory Visit (INDEPENDENT_AMBULATORY_CARE_PROVIDER_SITE_OTHER): Payer: 59 | Admitting: Family Medicine

## 2019-04-24 ENCOUNTER — Other Ambulatory Visit: Payer: Self-pay

## 2019-04-24 DIAGNOSIS — E785 Hyperlipidemia, unspecified: Secondary | ICD-10-CM

## 2019-04-24 DIAGNOSIS — E118 Type 2 diabetes mellitus with unspecified complications: Secondary | ICD-10-CM

## 2019-04-24 NOTE — Progress Notes (Signed)
Lab draw visit only

## 2019-04-25 LAB — CMP14+EGFR
ALT: 21 IU/L (ref 0–32)
AST: 26 IU/L (ref 0–40)
Albumin/Globulin Ratio: 1.3 (ref 1.2–2.2)
Albumin: 3.8 g/dL (ref 3.8–4.8)
Alkaline Phosphatase: 75 IU/L (ref 39–117)
BUN/Creatinine Ratio: 19 (ref 12–28)
BUN: 13 mg/dL (ref 8–27)
Bilirubin Total: 0.5 mg/dL (ref 0.0–1.2)
CO2: 19 mmol/L — ABNORMAL LOW (ref 20–29)
Calcium: 9.3 mg/dL (ref 8.7–10.3)
Chloride: 105 mmol/L (ref 96–106)
Creatinine, Ser: 0.7 mg/dL (ref 0.57–1.00)
GFR calc Af Amer: 106 mL/min/{1.73_m2} (ref >59)
GFR calc non Af Amer: 92 mL/min/{1.73_m2} (ref >59)
Globulin, Total: 3 g/dL (ref 1.5–4.5)
Glucose: 121 mg/dL — ABNORMAL HIGH (ref 65–99)
Potassium: 4.5 mmol/L (ref 3.5–5.2)
Sodium: 140 mmol/L (ref 134–144)
Total Protein: 6.8 g/dL (ref 6.0–8.5)

## 2019-04-25 LAB — HEMOGLOBIN A1C
Est. average glucose Bld gHb Est-mCnc: 143 mg/dL
Hgb A1c MFr Bld: 6.6 % — ABNORMAL HIGH (ref 4.8–5.6)

## 2019-04-25 LAB — LIPID PANEL
Chol/HDL Ratio: 5.7 ratio — ABNORMAL HIGH (ref 0.0–4.4)
Cholesterol, Total: 252 mg/dL — ABNORMAL HIGH (ref 100–199)
HDL: 44 mg/dL (ref >39)
LDL Chol Calc (NIH): 178 mg/dL — ABNORMAL HIGH (ref 0–99)
Triglycerides: 161 mg/dL — ABNORMAL HIGH (ref 0–149)
VLDL Cholesterol Cal: 30 mg/dL (ref 5–40)

## 2019-04-25 LAB — TSH: TSH: 1.68 u[IU]/mL (ref 0.450–4.500)

## 2019-04-28 ENCOUNTER — Other Ambulatory Visit: Payer: Self-pay

## 2019-04-28 ENCOUNTER — Ambulatory Visit: Payer: 59 | Admitting: Family Medicine

## 2019-04-28 ENCOUNTER — Encounter: Payer: Self-pay | Admitting: Family Medicine

## 2019-04-28 VITALS — BP 140/92 | HR 79 | Temp 97.9°F | Ht 64.5 in | Wt 266.0 lb

## 2019-04-28 DIAGNOSIS — E118 Type 2 diabetes mellitus with unspecified complications: Secondary | ICD-10-CM

## 2019-04-28 DIAGNOSIS — E785 Hyperlipidemia, unspecified: Secondary | ICD-10-CM | POA: Diagnosis not present

## 2019-04-28 DIAGNOSIS — Z23 Encounter for immunization: Secondary | ICD-10-CM | POA: Diagnosis not present

## 2019-04-28 DIAGNOSIS — R030 Elevated blood-pressure reading, without diagnosis of hypertension: Secondary | ICD-10-CM

## 2019-04-28 MED ORDER — ROSUVASTATIN CALCIUM 5 MG PO TABS: 5.0000 mg | ORAL_TABLET | Freq: Every day | ORAL | 3 refills | Status: DC

## 2019-04-28 NOTE — Progress Notes (Signed)
[id number] AM  Erroll Luna [id number], 64 y.o., female [id number]  Chief Complaint  Patient presents with  . Follow-up    lab follow up and to get second dose of shingle.     HPI:   Patient is a 64 y.o. female with past medical history significant for DM2, HLP, chronic headaches who presents today for routine followup  Overall doing well She has no acute concerns today DM2 diet controlled Declines eye exam and colonoscopy at this time Has been working out 3-4 times a week Fasting labs done prior to visit Reports strong fhx of HLP no CAD Has been checking BP at home < 120/80 Due for 2nd shingrix vaccine today  Wt Readings from Last 3 Encounters:  11/13/18 272 lb (123.4 kg)  10/21/18 279 lb (126.6 kg)  12/21/17 268 lb 9.6 oz (121.8 kg)    Lab Results  Component Value Date   HGBA1C 6.6 (H) 04/24/2019   Lab Results  Component Value Date   CREATININE 0.70 04/24/2019   BUN 13 04/24/2019   NA 140 04/24/2019   K 4.5 04/24/2019   CL 105 04/24/2019   CO2 19 (L) 04/24/2019   Lab Results  Component Value Date   TSH 1.680 04/24/2019   Lab Results  Component Value Date   CHOL 252 (H) 04/24/2019   HDL 44 04/24/2019   LDLCALC 178 (H) 04/24/2019   TRIG 161 (H) 04/24/2019   CHOLHDL 5.7 (H) 04/24/2019    Depression screen PHQ 2/9 04/28/2019 03/09/2019 11/13/2018  Decreased Interest 0 0 0  Down, Depressed, Hopeless 0 0 0  PHQ - 2 Score 0 0 0    Fall Risk  04/28/2019 03/09/2019 11/13/2018 10/09/2018 12/21/2017  Falls in the past year? 0 0 0 0 No  Number falls in past yr: 0 0 0 0 -  Injury with Fall? 0 0 0 - -  Follow up - - - Falls evaluation completed -     Allergies  Allergen Reactions  . Erythromycin Nausea Only    Prior to Admission medications   Medication Sig Start Date End Date Taking? Authorizing Provider  albuterol (VENTOLIN HFA) 108 (90 Base) MCG/ACT inhaler Inhale 2 puffs into the lungs every 6 (six) hours as needed for wheezing or shortness of  breath. 03/09/19  Yes Rutherford Guys, MD  butalbital-acetaminophen-caffeine [facility]) [phone] MG tablet Take 1-2 tablets by mouth every 6 (six) hours as needed for headache. 03/09/19 03/08/20 Yes Rutherford Guys, MD  famotidine (PEPCID) 20 MG tablet Take 1 tablet (20 mg total) by mouth 2 (two) times daily. 11/13/18  Yes Rutherford Guys, MD  IBUPROFEN PO Take by mouth.   Yes [provider]  Magnesium 250 MG TABS Take 1 tablet by mouth daily.   Yes [provider]  Multiple Vitamin (MULTIVITAMIN) tablet Take 1 tablet by mouth daily.   Yes [provider]  ondansetron (ZOFRAN) 8 MG tablet Take 1 tablet (8 mg total) by mouth every 8 (eight) hours as needed for nausea or vomiting. 03/09/19  Yes Rutherford Guys, MD  triamcinolone cream (KENALOG) 0.1 % Apply 1 application topically 2 (two) times daily. 12/13/16  Yes Jaynee Eagles, PA-C    Past Medical History:  Diagnosis Date  . Cataract   . Diabetes mellitus type 2, controlled ([place]) 12/01/2018   A1c 6.5  . Headache(784.0)   . Hyperlipidemia   . Morbid obesity ([place])     Past Surgical History:  Procedure Laterality Date  . BREAST  SURGERY     Breast reduction  . CESAREAN SECTION    . EYE SURGERY Right 11/13/11   cataract    Social History   Tobacco Use  . Smoking status: Never Smoker  . Smokeless tobacco: Never Used  Substance Use Topics  . Alcohol use: Yes    Alcohol/week: 2.0 standard drinks    Types: 2 Standard drinks or equivalent per week    Comment: few drinks per week    Family History  Problem Relation Age of Onset  . Hyperlipidemia Mother   . Cancer Mother        breast cancer  . Hypertension Father   . Diabetes Father   . Hyperlipidemia Father   . Cancer Father        prostate  . Cancer Maternal Grandmother        uterine and cervical  . Diabetes Paternal Grandmother   . Cancer Paternal Grandfather        Esophageal and lung cancer  . Cancer Sister        breast cancer  .  Hyperlipidemia Sister   . Diabetes Brother   . Hypertension Brother   . Hyperlipidemia Brother   . Hyperlipidemia Sister     Review of Systems  Constitutional: Negative for chills and fever.  Respiratory: Negative for cough and shortness of breath.   Cardiovascular: Negative for chest pain, palpitations and leg swelling.  Gastrointestinal: Negative for abdominal pain, nausea and vomiting.     OBJECTIVE:  Today's Vitals   04/28/19 1218  BP: (!) 140/92  Pulse: 79  Temp: 97.9 F (36.6 C)  SpO2: 99%  Weight: 266 lb (120.7 kg)  Height: 5' 4.5" (1.638 m)   Body mass index is 44.95 kg/m.   Physical Exam Vitals and nursing note reviewed.  Constitutional:      Appearance: She is well-developed.  HENT:     Head: Normocephalic and atraumatic.     Mouth/Throat:     Pharynx: No oropharyngeal exudate.  Eyes:     General: No scleral icterus.    Conjunctiva/sclera: Conjunctivae normal.     Pupils: Pupils are equal, round, and reactive to light.  Cardiovascular:     Rate and Rhythm: Normal rate and regular rhythm.     Heart sounds: Normal heart sounds. No murmur. No friction rub. No gallop.   Pulmonary:     Effort: Pulmonary effort is normal.     Breath sounds: Normal breath sounds. No wheezing or rales.  Musculoskeletal:     Cervical back: Neck supple.  Skin:    General: Skin is warm and dry.  Neurological:     Mental Status: She is alert and oriented to person, place, and time.    Diabetic Foot Form - Detailed   Diabetic Foot Exam - detailed Can the patient see the bottom of their feet?: No Are the shoes appropriate in style and fit?: No Is there swelling or and abnormal foot shape?: No Is there a claw toe deformity?: No Is there elevated skin temparature?: No Is there foot or ankle muscle weakness?: No Normal Range of Motion: No Semmes-Weinstein Monofilament Test R Site 1-Great Toe: Neg L Site 1-Great Toe: Neg        No results found for this or any previous  visit (from the past 24 hour(s)).  No results found.   ASSESSMENT and PLAN  1. Hyperlipidemia, unspecified hyperlipidemia type Worse. Starting crestor 5mg  daily. Reviewed r/se/b discussed LFM.  - Comprehensive metabolic panel; Future -  Lipid panel; Future   2. Controlled diabetes mellitus type 2 with complications, unspecified whether long term insulin use (HCC) Diet controlled. Cont working on [employer] - Microalbumin / creatinine urine ratio - HM DIABETES FOOT EXAM - Hemoglobin A1c; Future  3. Elevated BP without diagnosis of hypertension Slightly elevated BP in clinic, normal readings at home, cont with LFM and low salt diet. Recheck at next OV  4. Need for vaccination shingrix #2 given today   Other orders - rosuvastatin (CRESTOR) 5 MG tablet; Take 1 tablet (5 mg total) by mouth daily. - Varicella-zoster vaccine IM (Shingrix)  Return in about 6 months (around 10/26/2019).    Rutherford Guys, MD Primary Care at [facility] [place], [place] [address] Ph.  [phone] Fax [phone]

## 2019-04-28 NOTE — Patient Instructions (Addendum)
If you have lab work done today you will be contacted with your lab results within the next 2 weeks.  If you have not heard from [place] then please contact us. The fastest way to get your results is to register for My Chart.   IF you received an x-ray today, you will receive an invoice from [facility]. Please contact [facility] at [phone] with questions or concerns regarding your invoice.   IF you received labwork today, you will receive an invoice from [place]. Please contact [facility] at [phone] with questions or concerns regarding your invoice.   Our billing staff will not be able to assist you with questions regarding bills from these companies.  You will be contacted with the lab results as soon as they are available. The fastest way to get your results is to activate your My Chart account. Instructions are located on the last page of this paperwork. If you have not heard from [place] regarding the results in 2 weeks, please contact this office.     Fat and Cholesterol Restricted Eating Plan Eating a diet that limits fat and cholesterol may help lower your risk for heart disease and other conditions. Your body needs fat and cholesterol for basic functions, but eating too much of these things can be harmful to your health. Your health care provider may order lab tests to check your blood fat (lipid) and cholesterol levels. This helps your health care provider understand your risk for certain conditions and whether you need to make diet changes. Work with your health care provider or dietitian to make an eating plan that is right for you. Your plan includes:  Limit your fat intake to ______% or less of your total calories a day.  Limit your saturated fat intake to ______% or less of your total calories a day.  Limit the amount of cholesterol in your diet to less than _________mg a day.  Eat ___________ g of fiber a day. What are tips for following this  plan? General guidelines   If you are overweight, work with your health care provider to lose weight safely. Losing just 5-10% of your body weight can improve your overall health and help prevent diseases such as diabetes and heart disease.  Avoid: ? Foods with added sugar. ? Fried foods. ? Foods that contain partially hydrogenated oils, including stick margarine, some tub margarines, cookies, crackers, and other baked goods.  Limit alcohol intake to no more than 1 drink a day for nonpregnant women and 2 drinks a day for men. One drink equals 12 oz of beer, 5 oz of wine, or 1 oz of hard liquor. Reading food labels  Check food labels for: ? Trans fats, partially hydrogenated oils, or high amounts of saturated fat. Avoid foods that contain saturated fat and trans fat. ? The amount of cholesterol in each serving. Try to eat no more than 200 mg of cholesterol each day. ? The amount of fiber in each serving. Try to eat at least 20-30 g of fiber each day.  Choose foods with healthy fats, such as: ? Monounsaturated and polyunsaturated fats. These include olive and canola oil, flaxseeds, walnuts, almonds, and seeds. ? Omega-3 fats. These are found in foods such as salmon, mackerel, sardines, tuna, flaxseed oil, and ground flaxseeds.  Choose grain products that have whole grains. Look for the word "whole" as the first word in the ingredient list. Cooking  Cook foods using methods other than frying. Baking, boiling,  grilling, and broiling are some healthy options.  Eat more home-cooked food and less restaurant, buffet, and fast food.  Avoid cooking using saturated fats. ? Animal sources of saturated fats include meats, butter, and cream. ? Plant sources of saturated fats include palm oil, palm kernel oil, and coconut oil. Meal planning   At meals, imagine dividing your plate into fourths: ? Fill one-half of your plate with vegetables and green salads. ? Fill one-fourth of your plate with  whole grains. ? Fill one-fourth of your plate with lean protein foods.  Eat fish that is high in omega-3 fats at least two times a week.  Eat more foods that contain fiber, such as whole grains, beans, apples, broccoli, carrots, peas, and barley. These foods help promote healthy cholesterol levels in the blood. Recommended foods Grains  Whole grains, such as whole wheat or whole grain breads, crackers, cereals, and pasta. Unsweetened oatmeal, bulgur, barley, quinoa, or brown rice. Corn or whole wheat flour tortillas. Vegetables  Fresh or frozen vegetables (raw, steamed, roasted, or grilled). Green salads. Fruits  All fresh, canned (in natural juice), or frozen fruits. Meats and other protein foods  Ground beef (85% or leaner), grass-fed beef, or beef trimmed of fat. Skinless chicken or [place]. Ground chicken or [place]. Pork trimmed of fat. All fish and seafood. Egg whites. Dried beans, peas, or lentils. Unsalted nuts or seeds. Unsalted canned beans. Natural nut butters without added sugar and oil. Dairy  Low-fat or nonfat dairy products, such as skim or 1% milk, 2% or reduced-fat cheeses, low-fat and fat-free ricotta or cottage cheese, or plain low-fat and nonfat yogurt. Fats and oils  Tub margarine without trans fats. Light or reduced-fat mayonnaise and salad dressings. Avocado. Olive, canola, sesame, or safflower oils. The items listed above may not be a complete list of recommended foods or beverages. Contact your dietitian for more options. Foods to avoid Grains  White bread. White pasta. White rice. Cornbread. Bagels, pastries, and croissants. Crackers and snack foods that contain trans fat and hydrogenated oils. Vegetables  Vegetables cooked in cheese, cream, or butter sauce. Fried vegetables. Fruits  Canned fruit in heavy syrup. Fruit in cream or butter sauce. Fried fruit. Meats and other protein foods  Fatty cuts of meat. Ribs, chicken wings, bacon, sausage, bologna,  salami, chitterlings, fatback, hot dogs, bratwurst, and packaged lunch meats. Liver and organ meats. Whole eggs and egg yolks. Chicken and [place] with skin. Fried meat. Dairy  Whole or 2% milk, cream, half-and-half, and cream cheese. Whole milk cheeses. Whole-fat or sweetened yogurt. Full-fat cheeses. Nondairy creamers and whipped toppings. Processed cheese, cheese spreads, and cheese curds. Beverages  Alcohol. Sugar-sweetened drinks such as sodas, lemonade, and fruit drinks. Fats and oils  Butter, stick margarine, lard, shortening, ghee, or bacon fat. Coconut, palm kernel, and palm oils. Sweets and desserts  Corn syrup, sugars, honey, and molasses. Candy. Jam and jelly. Syrup. Sweetened cereals. Cookies, pies, cakes, donuts, muffins, and ice cream. The items listed above may not be a complete list of foods and beverages to avoid. Contact your dietitian for more information. Summary  Your body needs fat and cholesterol for basic functions. However, eating too much of these things can be harmful to your health.  Work with your health care provider and dietitian to follow a diet low in fat and cholesterol. Doing this may help lower your risk for heart disease and other conditions.  Choose healthy fats, such as monounsaturated and polyunsaturated fats, and foods high in omega-3  fatty acids.  Eat fiber-rich foods, such as whole grains, beans, peas, fruits, and vegetables.  Limit or avoid alcohol, fried foods, and foods high in saturated fats, partially hydrogenated oils, and sugar. This information is not intended to replace advice given to you by your health care provider. Make sure you discuss any questions you have with your health care provider. Document Revised: 02/15/2017 Document Reviewed: 11/20/2016 Elsevier Patient Education  2020 [employer].  "

## 2019-04-29 LAB — MICROALBUMIN / CREATININE URINE RATIO
Creatinine, Urine: 133.5 mg/dL
Microalb/Creat Ratio: 11 mg/g creat (ref 0–29)
Microalbumin, Urine: 14.2 ug/mL

## 2019-06-01 ENCOUNTER — Ambulatory Visit: Payer: 59

## 2019-06-04 ENCOUNTER — Ambulatory Visit: Payer: 59

## 2019-10-27 ENCOUNTER — Encounter: Payer: Self-pay | Admitting: Family Medicine

## 2019-10-27 ENCOUNTER — Ambulatory Visit (INDEPENDENT_AMBULATORY_CARE_PROVIDER_SITE_OTHER): Payer: PPO | Admitting: Family Medicine

## 2019-10-27 ENCOUNTER — Other Ambulatory Visit: Payer: Self-pay

## 2019-10-27 VITALS — BP 132/76 | HR 76 | Temp 98.1°F | Resp 16 | Ht 64.5 in

## 2019-10-27 DIAGNOSIS — Z1211 Encounter for screening for malignant neoplasm of colon: Secondary | ICD-10-CM

## 2019-10-27 DIAGNOSIS — E119 Type 2 diabetes mellitus without complications: Secondary | ICD-10-CM | POA: Diagnosis not present

## 2019-10-27 DIAGNOSIS — E7800 Pure hypercholesterolemia, unspecified: Secondary | ICD-10-CM | POA: Diagnosis not present

## 2019-10-27 DIAGNOSIS — Z780 Asymptomatic menopausal state: Secondary | ICD-10-CM

## 2019-10-27 DIAGNOSIS — Z23 Encounter for immunization: Secondary | ICD-10-CM | POA: Diagnosis not present

## 2019-10-27 MED ORDER — ROSUVASTATIN CALCIUM 5 MG PO TABS: 5.0000 mg | ORAL_TABLET | ORAL | 3 refills | Status: DC

## 2019-10-27 NOTE — Progress Notes (Signed)
0/09/233007:62 AM  Cynthia Fitzgerald [birth year], 64 y.o., female [id number]  Chief Complaint  Patient presents with  . Hyperlipidemia    pt was put on Rosuvastatin started getting leg pains, stopped 7/16 pain went away restarted on 7/26 and the pain returned by 8/6, pt has been trying to do exercise but feels unable, has tried to cut back on fatty foods and sugar to help improve her numbers   . Diabetes    pt has been working on her diet and some exercise to help with numbers has not been tracking at home BG   . Hypertension    pt has been tracking BP at home and reports averages 116/70 and is only high in doctors office   . Referral    Dexa scan and GI for colonoscopy or cologard pt is unsure which she should do, pt also would like to know if she should receive pneumonia     HPI:   Patient is a 64 y.o. female with past medical history significant for  DM2, HLP, chronic headaches who presents today for routine followup  Last OV feb 2021 - started crestor 5mg  She is overall doing well except has not been tolerating crestor due to muscle pain, she stopped for 10 days - pain resolved, started again and pain now resolved She has no acute concerns today  Lab Results  Component Value Date   HGBA1C 6.6 (H) 04/24/2019   HGBA1C 6.5 (H) 11/13/2018   HGBA1C 6.2 (H) 07/29/2014   Lab Results  Component Value Date   LDLCALC 178 (H) 04/24/2019   CREATININE 0.70 04/24/2019    Depression screen PHQ 2/9 10/27/2019 04/28/2019 03/09/2019  Decreased Interest 0 0 0  Down, Depressed, Hopeless 0 0 0  PHQ - 2 Score 0 0 0    Fall Risk  10/27/2019 04/28/2019 03/09/2019 11/13/2018 10/09/2018  Falls in the past year? 0 0 0 0 0  Number falls in past yr: 0 0 0 0 0  Injury with Fall? 0 0 0 0 -  Risk for fall due to : No Fall Risks - - - -  Follow up Falls evaluation completed - - - Falls evaluation completed     Allergies  Allergen Reactions  . Erythromycin Nausea Only    Prior to Admission medications    Medication Sig Start Date End Date Taking? Authorizing Provider  albuterol (VENTOLIN HFA) 108 (90 Base) MCG/ACT inhaler Inhale 2 puffs into the lungs every 6 (six) hours as needed for wheezing or shortness of breath. 03/09/19  Yes Rutherford Guys, MD  butalbital-acetaminophen-caffeine [facility]) [id number] MG tablet Take 1-2 tablets by mouth every 6 (six) hours as needed for headache. 03/09/19 03/08/20 Yes Rutherford Guys, MD  famotidine (PEPCID) 20 MG tablet Take 1 tablet (20 mg total) by mouth 2 (two) times daily. 11/13/18  Yes Rutherford Guys, MD  IBUPROFEN PO Take by mouth.   Yes [provider]  Magnesium 250 MG TABS Take 1 tablet by mouth daily.   Yes [provider]  Multiple Vitamin (MULTIVITAMIN) tablet Take 1 tablet by mouth daily.   Yes [provider]  ondansetron (ZOFRAN) 8 MG tablet Take 1 tablet (8 mg total) by mouth every 8 (eight) hours as needed for nausea or vomiting. 03/09/19  Yes Rutherford Guys, MD  rosuvastatin (CRESTOR) 5 MG tablet Take 1 tablet (5 mg total) by mouth daily. 04/28/19  Yes Rutherford Guys, MD  triamcinolone cream (KENALOG) 0.1 % Apply 1 application  topically 2 (two) times daily. 12/13/16  Yes Jaynee Eagles, PA-C    Past Medical History:  Diagnosis Date  . Cataract   . Diabetes mellitus type 2, controlled ([place]) 12/01/2018   A1c 6.5  . Headache(784.0)   . Hyperlipidemia   . Morbid obesity ([place])     Past Surgical History:  Procedure Laterality Date  . BREAST SURGERY     Breast reduction  . CESAREAN SECTION    . EYE SURGERY Right 11/13/11   cataract    Social History   Tobacco Use  . Smoking status: Never Smoker  . Smokeless tobacco: Never Used  Substance Use Topics  . Alcohol use: Yes    Alcohol/week: 2.0 standard drinks    Types: 2 Standard drinks or equivalent per week    Comment: few drinks per week    Family History  Problem Relation Age of Onset  . Hyperlipidemia Mother   . Cancer Mother        breast  cancer  . Hypertension Father   . Diabetes Father   . Hyperlipidemia Father   . Cancer Father        prostate  . Cancer Maternal Grandmother        uterine and cervical  . Diabetes Paternal Grandmother   . Cancer Paternal Grandfather        Esophageal and lung cancer  . Cancer Sister        breast cancer  . Hyperlipidemia Sister   . Diabetes Brother   . Hypertension Brother   . Hyperlipidemia Brother   . Hyperlipidemia Sister     Review of Systems  Constitutional: Negative for chills and fever.  Respiratory: Negative for cough and shortness of breath.   Cardiovascular: Negative for chest pain, palpitations and leg swelling.  Gastrointestinal: Negative for abdominal pain, nausea and vomiting.     OBJECTIVE:  Today's Vitals   10/27/19 1052 10/27/19 1101  BP: (!) 141/86 132/76  Pulse: 76   Resp: 16   Temp: 98.1 F (36.7 C)   TempSrc: Temporal   SpO2: 97%   Height: 5' 4.5" (1.638 m)    Body mass index is 44.95 kg/m.    l Physical Exam Vitals and nursing note reviewed.  Constitutional:      Appearance: She is well-developed.  HENT:     Head: Normocephalic and atraumatic.     Mouth/Throat:     Pharynx: No oropharyngeal exudate.  Eyes:     General: No scleral icterus.    Extraocular Movements: Extraocular movements intact.     Conjunctiva/sclera: Conjunctivae normal.     Pupils: Pupils are equal, round, and reactive to light.  Cardiovascular:     Rate and Rhythm: Normal rate and regular rhythm.     Heart sounds: Normal heart sounds. No murmur heard.  No friction rub. No gallop.   Pulmonary:     Effort: Pulmonary effort is normal.     Breath sounds: Normal breath sounds. No wheezing, rhonchi or rales.  Musculoskeletal:     Cervical back: Neck supple.  Skin:    General: Skin is warm and dry.  Neurological:     Mental Status: She is alert and oriented to person, place, and time.     No results found for this or any previous visit (from the past 24  hour(s)).  No results found.   ASSESSMENT and PLAN  1. Controlled type 2 diabetes mellitus without complication, without long-term current use of insulin (HCC) Diet controlled. Cont  working on [employer]. Labs pending. - Hemoglobin A1c - Microalbumin/Creatinine Ratio, Urine  2. Pure hypercholesterolemia Intolerant of daily use. Trial of 2 x week, labs pending. Cont working on [employer] - Lipid panel - Comprehensive metabolic panel - CK  3. Need for vaccination - Pneumococcal conjugate vaccine 13-valent IM  4. Postmenopausal estrogen deficiency - DG Bone Density; Future  5. Colon cancer screening - Ambulatory referral to Gastroenterology  Other orders - rosuvastatin (CRESTOR) 5 MG tablet; Take 1 tablet (5 mg total) by mouth 2 (two) times a week.  Return in about 6 months (around 04/28/2020).    Rutherford Guys, MD [facility] [place], [place] [address] Ph.  [phone] Fax [phone]

## 2019-10-27 NOTE — Patient Instructions (Addendum)
If you have lab work done today you will be contacted with your lab results within the next 2 weeks.  If you have not heard from [place] then please contact us. The fastest way to get your results is to register for My Chart.   IF you received an x-ray today, you will receive an invoice from [facility]. Please contact [facility] at [phone] with questions or concerns regarding your invoice.   IF you received labwork today, you will receive an invoice from [place]. Please contact [facility] at [phone] with questions or concerns regarding your invoice.   Our billing staff will not be able to assist you with questions regarding bills from these companies.  You will be contacted with the lab results as soon as they are available. The fastest way to get your results is to activate your My Chart account. Instructions are located on the last page of this paperwork. If you have not heard from [place] regarding the results in 2 weeks, please contact this office.     Mediterranean Diet A Mediterranean diet refers to food and lifestyle choices that are based on the traditions of countries located on the [employer]. This way of eating has been shown to help prevent certain conditions and improve outcomes for people who have chronic diseases, like kidney disease and heart disease. What are tips for following this plan? Lifestyle  Cook and eat meals together with your family, when possible.  Drink enough fluid to keep your urine clear or pale yellow.  Be physically active every day. This includes: ? Aerobic exercise like running or swimming. ? Leisure activities like gardening, walking, or housework.  Get 7-8 hours of sleep each night.  If recommended by your health care provider, drink red wine in moderation. This means 1 glass a day for nonpregnant women and 2 glasses a day for men. A glass of wine equals 5 oz (150 mL). Reading food labels   Check the serving size  of packaged foods. For foods such as rice and pasta, the serving size refers to the amount of cooked product, not dry.  Check the total fat in packaged foods. Avoid foods that have saturated fat or trans fats.  Check the ingredients list for added sugars, such as corn syrup. Shopping  At the grocery store, buy most of your food from the areas near the walls of the store. This includes: ? Fresh fruits and vegetables (produce). ? Grains, beans, nuts, and seeds. Some of these may be available in unpackaged forms or large amounts (in bulk). ? Fresh seafood. ? Poultry and eggs. ? Low-fat dairy products.  Buy whole ingredients instead of prepackaged foods.  Buy fresh fruits and vegetables in-season from local farmers markets.  Buy frozen fruits and vegetables in resealable bags.  If you do not have access to quality fresh seafood, buy precooked frozen shrimp or canned fish, such as tuna, salmon, or sardines.  Buy small amounts of raw or cooked vegetables, salads, or olives from the deli or salad bar at your store.  Stock your pantry so you always have certain foods on hand, such as olive oil, canned tuna, canned tomatoes, rice, pasta, and beans. Cooking  Cook foods with extra-virgin olive oil instead of using butter or other vegetable oils.  Have meat as a side dish, and have vegetables or grains as your main dish. This means having meat in small portions or adding small amounts of meat to foods like pasta or  stew.  Use beans or vegetables instead of meat in common dishes like chili or lasagna.  Experiment with different cooking methods. Try roasting or broiling vegetables instead of steaming or sauteing them.  Add frozen vegetables to soups, stews, pasta, or rice.  Add nuts or seeds for added healthy fat at each meal. You can add these to yogurt, salads, or vegetable dishes.  Marinate fish or vegetables using olive oil, lemon juice, garlic, and fresh herbs. Meal planning   Plan  to eat 1 vegetarian meal one day each week. Try to work up to 2 vegetarian meals, if possible.  Eat seafood 2 or more times a week.  Have healthy snacks readily available, such as: ? Vegetable sticks with hummus. ? [place] yogurt. ? Fruit and nut trail mix.  Eat balanced meals throughout the week. This includes: ? Fruit: 2-3 servings a day ? Vegetables: 4-5 servings a day ? Low-fat dairy: 2 servings a day ? Fish, poultry, or lean meat: 1 serving a day ? Beans and legumes: 2 or more servings a week ? Nuts and seeds: 1-2 servings a day ? Whole grains: 6-8 servings a day ? Extra-virgin olive oil: 3-4 servings a day  Limit red meat and sweets to only a few servings a month What are my food choices?  Mediterranean diet ? Recommended  Grains: Whole-grain pasta. Brown rice. Bulgar wheat. Polenta. Couscous. Whole-wheat bread. [place].  Vegetables: Artichokes. Beets. Broccoli. Cabbage. Carrots. Eggplant. Green beans. Chard. Kale. Spinach. Onions. Leeks. Peas. Squash. Tomatoes. Peppers. Radishes.  Fruits: Apples. Apricots. Avocado. Berries. Bananas. Cherries. Dates. Figs. Grapes. Lemons. Melon. Oranges. Peaches. Plums. Pomegranate.  Meats and other protein foods: Beans. Almonds. Sunflower seeds. Pine nuts. Peanuts. [place]. Salmon. Scallops. Shrimp. [place]. Tilapia. Clams. Oysters. Eggs.  Dairy: Low-fat milk. Cheese. Greek yogurt.  Beverages: Water. Red wine. Herbal tea.  Fats and oils: Extra virgin olive oil. Avocado oil. Grape seed oil.  Sweets and desserts: [place] yogurt with honey. Baked apples. Poached pears. Trail mix.  Seasoning and other foods: Basil. Cilantro. Coriander. Cumin. Mint. Parsley. Sage. Rosemary. Tarragon. Garlic. Oregano. Thyme. Pepper. Balsalmic vinegar. Tahini. Hummus. Tomato sauce. Olives. Mushrooms. ? Limit these  Grains: Prepackaged pasta or rice dishes. Prepackaged cereal with added sugar.  Vegetables: Deep fried potatoes (french fries).  Fruits: Fruit  canned in syrup.  Meats and other protein foods: Beef. Pork. Lamb. Poultry with skin. Hot dogs. [place] Salines.  Dairy: Ice cream. Sour cream. Whole milk.  Beverages: Juice. Sugar-sweetened soft drinks. Beer. Liquor and spirits.  Fats and oils: Butter. Canola oil. Vegetable oil. Beef fat (tallow). Lard.  Sweets and desserts: Cookies. Cakes. Pies. Candy.  Seasoning and other foods: Mayonnaise. Premade sauces and marinades. The items listed may not be a complete list. Talk with your dietitian about what dietary choices are right for you. Summary  The Mediterranean diet includes both food and lifestyle choices.  Eat a variety of fresh fruits and vegetables, beans, nuts, seeds, and whole grains.  Limit the amount of red meat and sweets that you eat.  Talk with your health care provider about whether it is safe for you to drink red wine in moderation. This means 1 glass a day for nonpregnant women and 2 glasses a day for men. A glass of wine equals 5 oz (150 mL). This information is not intended to replace advice given to you by your health care provider. Make sure you discuss any questions you have with your health care provider. Document Revised: 11/03/2015 Document Reviewed:  10/27/2015 Elsevier Patient Education  [place].

## 2019-10-28 LAB — COMPREHENSIVE METABOLIC PANEL
ALT: 15 IU/L (ref 0–32)
AST: 23 IU/L (ref 0–40)
Albumin/Globulin Ratio: 1.6 (ref 1.2–2.2)
Albumin: 4.4 g/dL (ref 3.8–4.8)
Alkaline Phosphatase: 83 IU/L (ref 48–121)
BUN/Creatinine Ratio: 22 (ref 12–28)
BUN: 14 mg/dL (ref 8–27)
Bilirubin Total: 0.4 mg/dL (ref 0.0–1.2)
CO2: 20 mmol/L (ref 20–29)
Calcium: 9.4 mg/dL (ref 8.7–10.3)
Chloride: 103 mmol/L (ref 96–106)
Creatinine, Ser: 0.64 mg/dL (ref 0.57–1.00)
GFR calc Af Amer: 108 mL/min/{1.73_m2} (ref >59)
GFR calc non Af Amer: 94 mL/min/{1.73_m2} (ref >59)
Globulin, Total: 2.8 g/dL (ref 1.5–4.5)
Glucose: 107 mg/dL — ABNORMAL HIGH (ref 65–99)
Potassium: 4.4 mmol/L (ref 3.5–5.2)
Sodium: 140 mmol/L (ref 134–144)
Total Protein: 7.2 g/dL (ref 6.0–8.5)

## 2019-10-28 LAB — LIPID PANEL
Chol/HDL Ratio: 4.2 ratio (ref 0.0–4.4)
Cholesterol, Total: 196 mg/dL (ref 100–199)
HDL: 47 mg/dL (ref >39)
LDL Chol Calc (NIH): 112 mg/dL — ABNORMAL HIGH (ref 0–99)
Triglycerides: 214 mg/dL — ABNORMAL HIGH (ref 0–149)
VLDL Cholesterol Cal: 37 mg/dL (ref 5–40)

## 2019-10-28 LAB — MICROALBUMIN / CREATININE URINE RATIO
Creatinine, Urine: 149.8 mg/dL
Microalb/Creat Ratio: 8 mg/g creat (ref 0–29)
Microalbumin, Urine: 12 ug/mL

## 2019-10-28 LAB — HEMOGLOBIN A1C
Est. average glucose Bld gHb Est-mCnc: 137 mg/dL
Hgb A1c MFr Bld: 6.4 % — ABNORMAL HIGH (ref 4.8–5.6)

## 2019-10-28 LAB — CK: Total CK: 257 U/L — ABNORMAL HIGH (ref 32–182)

## 2020-01-13 ENCOUNTER — Telehealth: Payer: Self-pay | Admitting: Family Medicine

## 2020-01-13 NOTE — Telephone Encounter (Signed)
Pt is needing a request from pcp  order for diagnostic mammo and breast exam because of family history  Sent to [facility] mammography   Please reach out to patient    Please advise

## 2020-01-13 NOTE — Telephone Encounter (Signed)
I have attempted to call pt no answer. I also called [facility] and confirmed that they have a screening order on file and she is to be seen on or after 02/03/20 in order to get that done.   Now if she wants a Dx mammo done she will have to talk to a new provider in order to get that. She was a former patient of Dr. Pamella Pert and she will need to make a TOC visit to continue her care with [place].

## 2020-01-26 NOTE — Telephone Encounter (Signed)
Called pt to schedule appt to set up to have the new mamo diagnostic order and pt stated she will not make an appt she was just seen by Dr. Pamella Pert and stated if she can not get this order without coming in she will be leaving the practice I stated to pt that we will be needing that appt for this new order with a provider and pt stated again she does not want it. Pt was very unhappy. I apologized and I explained to her why this needed to happen. Pt was still not happy. Please advise.

## 2020-01-26 NOTE — Telephone Encounter (Signed)
Pt was last seen by Dr Pamella Pert on 10/27/2019.  Cannot place new order for a mammogram without her seeing a provider who is still with the practice. If pt refuses appointment we are unable to provide the order.

## 2020-01-26 NOTE — Telephone Encounter (Signed)
01/26/2020 - PATIENT HAS MANY CONCERNS SHE WOULD LIKE TO DISCUSS REGARDING HER DIAGNOSTIC MAMMOGRAM AND THE BONE DENSITY THAT WAS NEVER PUT IN. I TRIED TO SCHEDULE A TRANSFER OF CARE WITH ANOTHER PROVIDER BUT PATIENT DID NOT WANT TO AT THIS TIME. SHE IS A PREVIOUS PATIENT OF DR. SANTIAGO'S. BEST PHONE [phone] (CELL)  [place]

## 2020-01-26 NOTE — Telephone Encounter (Signed)
Needs appt. We cannot order Mammo without a visit. And to discuss concerns probably best to meet with a provider anyways

## 2020-02-19 DIAGNOSIS — Z1231 Encounter for screening mammogram for malignant neoplasm of breast: Secondary | ICD-10-CM | POA: Diagnosis not present

## 2020-02-19 DIAGNOSIS — Z803 Family history of malignant neoplasm of breast: Secondary | ICD-10-CM | POA: Diagnosis not present

## 2020-02-19 LAB — HM MAMMOGRAPHY

## 2020-02-23 ENCOUNTER — Encounter: Payer: Self-pay | Admitting: *Deleted

## 2020-04-07 DIAGNOSIS — Z803 Family history of malignant neoplasm of breast: Secondary | ICD-10-CM | POA: Diagnosis not present

## 2020-04-07 DIAGNOSIS — R7303 Prediabetes: Secondary | ICD-10-CM | POA: Diagnosis not present

## 2020-04-07 DIAGNOSIS — E785 Hyperlipidemia, unspecified: Secondary | ICD-10-CM | POA: Diagnosis not present

## 2020-04-07 DIAGNOSIS — R748 Abnormal levels of other serum enzymes: Secondary | ICD-10-CM | POA: Diagnosis not present

## 2020-06-16 DIAGNOSIS — E785 Hyperlipidemia, unspecified: Secondary | ICD-10-CM | POA: Diagnosis not present

## 2020-06-16 DIAGNOSIS — R748 Abnormal levels of other serum enzymes: Secondary | ICD-10-CM | POA: Diagnosis not present

## 2020-06-27 DIAGNOSIS — Z789 Other specified health status: Secondary | ICD-10-CM | POA: Diagnosis not present

## 2020-06-27 DIAGNOSIS — R7303 Prediabetes: Secondary | ICD-10-CM | POA: Diagnosis not present

## 2020-06-27 DIAGNOSIS — K219 Gastro-esophageal reflux disease without esophagitis: Secondary | ICD-10-CM | POA: Diagnosis not present

## 2020-06-27 DIAGNOSIS — E785 Hyperlipidemia, unspecified: Secondary | ICD-10-CM | POA: Diagnosis not present

## 2020-06-27 DIAGNOSIS — R748 Abnormal levels of other serum enzymes: Secondary | ICD-10-CM | POA: Diagnosis not present

## 2020-08-04 DIAGNOSIS — H35371 Puckering of macula, right eye: Secondary | ICD-10-CM | POA: Diagnosis not present

## 2020-08-04 DIAGNOSIS — H26491 Other secondary cataract, right eye: Secondary | ICD-10-CM | POA: Diagnosis not present

## 2020-08-04 DIAGNOSIS — H52203 Unspecified astigmatism, bilateral: Secondary | ICD-10-CM | POA: Diagnosis not present

## 2020-08-25 DIAGNOSIS — H26491 Other secondary cataract, right eye: Secondary | ICD-10-CM | POA: Diagnosis not present

## 2020-09-12 DIAGNOSIS — H35371 Puckering of macula, right eye: Secondary | ICD-10-CM | POA: Diagnosis not present

## 2020-10-03 DIAGNOSIS — E785 Hyperlipidemia, unspecified: Secondary | ICD-10-CM | POA: Diagnosis not present

## 2020-10-03 DIAGNOSIS — R7303 Prediabetes: Secondary | ICD-10-CM | POA: Diagnosis not present

## 2020-10-05 DIAGNOSIS — H43813 Vitreous degeneration, bilateral: Secondary | ICD-10-CM | POA: Diagnosis not present

## 2020-10-05 DIAGNOSIS — H3581 Retinal edema: Secondary | ICD-10-CM | POA: Diagnosis not present

## 2020-10-05 DIAGNOSIS — H26492 Other secondary cataract, left eye: Secondary | ICD-10-CM | POA: Diagnosis not present

## 2020-10-05 DIAGNOSIS — H35373 Puckering of macula, bilateral: Secondary | ICD-10-CM | POA: Diagnosis not present

## 2020-10-12 DIAGNOSIS — Z789 Other specified health status: Secondary | ICD-10-CM | POA: Diagnosis not present

## 2020-10-12 DIAGNOSIS — E785 Hyperlipidemia, unspecified: Secondary | ICD-10-CM | POA: Diagnosis not present

## 2020-10-12 DIAGNOSIS — E119 Type 2 diabetes mellitus without complications: Secondary | ICD-10-CM | POA: Diagnosis not present

## 2020-11-10 ENCOUNTER — Other Ambulatory Visit: Payer: Self-pay | Admitting: Gastroenterology

## 2020-11-10 DIAGNOSIS — Z1211 Encounter for screening for malignant neoplasm of colon: Secondary | ICD-10-CM | POA: Diagnosis not present

## 2020-11-10 DIAGNOSIS — K625 Hemorrhage of anus and rectum: Secondary | ICD-10-CM | POA: Diagnosis not present

## 2020-11-10 DIAGNOSIS — K219 Gastro-esophageal reflux disease without esophagitis: Secondary | ICD-10-CM | POA: Diagnosis not present

## 2020-11-10 DIAGNOSIS — R194 Change in bowel habit: Secondary | ICD-10-CM | POA: Diagnosis not present

## 2020-11-14 DIAGNOSIS — R634 Abnormal weight loss: Secondary | ICD-10-CM | POA: Diagnosis not present

## 2020-11-14 DIAGNOSIS — E785 Hyperlipidemia, unspecified: Secondary | ICD-10-CM | POA: Diagnosis not present

## 2020-11-14 DIAGNOSIS — E119 Type 2 diabetes mellitus without complications: Secondary | ICD-10-CM | POA: Diagnosis not present

## 2020-11-14 DIAGNOSIS — K219 Gastro-esophageal reflux disease without esophagitis: Secondary | ICD-10-CM | POA: Diagnosis not present

## 2020-12-15 DIAGNOSIS — H35371 Puckering of macula, right eye: Secondary | ICD-10-CM | POA: Diagnosis not present

## 2020-12-15 DIAGNOSIS — H3581 Retinal edema: Secondary | ICD-10-CM | POA: Diagnosis not present

## 2020-12-16 DIAGNOSIS — H3581 Retinal edema: Secondary | ICD-10-CM | POA: Diagnosis not present

## 2020-12-16 DIAGNOSIS — H35371 Puckering of macula, right eye: Secondary | ICD-10-CM | POA: Diagnosis not present

## 2020-12-21 ENCOUNTER — Encounter (HOSPITAL_COMMUNITY): Payer: Self-pay | Admitting: Gastroenterology

## 2020-12-27 DIAGNOSIS — H3581 Retinal edema: Secondary | ICD-10-CM | POA: Diagnosis not present

## 2020-12-27 DIAGNOSIS — H35371 Puckering of macula, right eye: Secondary | ICD-10-CM | POA: Diagnosis not present

## 2020-12-30 ENCOUNTER — Ambulatory Visit (HOSPITAL_COMMUNITY): Payer: PPO | Admitting: Anesthesiology

## 2020-12-30 ENCOUNTER — Other Ambulatory Visit: Payer: Self-pay

## 2020-12-30 ENCOUNTER — Ambulatory Visit (HOSPITAL_COMMUNITY)
Admission: RE | Admit: 2020-12-30 | Discharge: 2020-12-30 | Disposition: A | Discharge status: Home / Self Care | Payer: PPO | Attending: Gastroenterology | Admitting: Gastroenterology

## 2020-12-30 ENCOUNTER — Encounter (HOSPITAL_COMMUNITY): Payer: Self-pay | Admitting: Gastroenterology

## 2020-12-30 ENCOUNTER — Encounter (HOSPITAL_COMMUNITY)
Admission: RE | Disposition: A | Discharge status: Home / Self Care | Payer: Self-pay | Source: Home / Self Care | Attending: Gastroenterology

## 2020-12-30 DIAGNOSIS — R194 Change in bowel habit: Secondary | ICD-10-CM | POA: Diagnosis not present

## 2020-12-30 DIAGNOSIS — K635 Polyp of colon: Secondary | ICD-10-CM | POA: Insufficient documentation

## 2020-12-30 DIAGNOSIS — Z79899 Other long term (current) drug therapy: Secondary | ICD-10-CM | POA: Insufficient documentation

## 2020-12-30 DIAGNOSIS — K625 Hemorrhage of anus and rectum: Secondary | ICD-10-CM | POA: Diagnosis not present

## 2020-12-30 DIAGNOSIS — Z8616 Personal history of COVID-19: Secondary | ICD-10-CM | POA: Insufficient documentation

## 2020-12-30 DIAGNOSIS — D123 Benign neoplasm of transverse colon: Secondary | ICD-10-CM | POA: Insufficient documentation

## 2020-12-30 DIAGNOSIS — Z881 Allergy status to other antibiotic agents status: Secondary | ICD-10-CM | POA: Diagnosis not present

## 2020-12-30 DIAGNOSIS — K5730 Diverticulosis of large intestine without perforation or abscess without bleeding: Secondary | ICD-10-CM | POA: Insufficient documentation

## 2020-12-30 DIAGNOSIS — K921 Melena: Secondary | ICD-10-CM | POA: Insufficient documentation

## 2020-12-30 DIAGNOSIS — E785 Hyperlipidemia, unspecified: Secondary | ICD-10-CM | POA: Diagnosis not present

## 2020-12-30 DIAGNOSIS — K219 Gastro-esophageal reflux disease without esophagitis: Secondary | ICD-10-CM | POA: Insufficient documentation

## 2020-12-30 DIAGNOSIS — E119 Type 2 diabetes mellitus without complications: Secondary | ICD-10-CM | POA: Diagnosis not present

## 2020-12-30 HISTORY — PX: POLYPECTOMY: SHX5525

## 2020-12-30 HISTORY — PX: COLONOSCOPY WITH PROPOFOL: SHX5780

## 2020-12-30 SURGERY — COLONOSCOPY WITH PROPOFOL
Anesthesia: Monitor Anesthesia Care

## 2020-12-30 MED ORDER — PROPOFOL 10 MG/ML IV BOLUS
INTRAVENOUS | Status: DC | PRN
Start: 2020-12-30 — End: 2020-12-30
  Administered 2020-12-30: 30 mg via INTRAVENOUS
  Administered 2020-12-30: 20 mg via INTRAVENOUS

## 2020-12-30 MED ORDER — LACTATED RINGERS IV SOLN: INTRAVENOUS | Status: DC

## 2020-12-30 MED ORDER — LACTATED RINGERS IV SOLN
INTRAVENOUS | Status: AC | PRN
  Administered 2020-12-30: 1000 mL via INTRAVENOUS

## 2020-12-30 MED ORDER — PROPOFOL 500 MG/50ML IV EMUL
INTRAVENOUS | Status: DC | PRN
  Administered 2020-12-30: 125 ug/kg/min via INTRAVENOUS

## 2020-12-30 MED ORDER — SODIUM CHLORIDE 0.9 % IV SOLN: INTRAVENOUS | Status: DC

## 2020-12-30 MED ORDER — PHENYLEPHRINE 40 MCG/ML (10ML) SYRINGE FOR IV PUSH (FOR BLOOD PRESSURE SUPPORT)
PREFILLED_SYRINGE | INTRAVENOUS | Status: DC | PRN
  Administered 2020-12-30: 120 ug via INTRAVENOUS

## 2020-12-30 MED ORDER — PROPOFOL 500 MG/50ML IV EMUL
INTRAVENOUS | Status: AC
  Filled 2020-12-30: qty 50

## 2020-12-30 SURGICAL SUPPLY — 22 items

## 2020-12-30 NOTE — Discharge Instructions (Signed)

## 2020-12-30 NOTE — Anesthesia Preprocedure Evaluation (Signed)
Anesthesia Evaluation  Patient identified by MRN, date of birth, ID band Patient awake    Reviewed: Allergy & Precautions, NPO status , Patient's Chart, lab work & pertinent test results  Airway Mallampati: II  TM Distance: >3 FB Neck ROM: Full    Dental no notable dental hx.    Pulmonary neg pulmonary ROS,    Pulmonary exam normal breath sounds clear to auscultation       Cardiovascular negative cardio ROS Normal cardiovascular exam Rhythm:Regular Rate:Normal     Neuro/Psych negative neurological ROS  negative psych ROS   GI/Hepatic negative GI ROS, Neg liver ROS,   Endo/Other  diabetes, Type 2Morbid obesity  Renal/GU negative Renal ROS  negative genitourinary   Musculoskeletal negative musculoskeletal ROS (+)   Abdominal   Peds negative pediatric ROS (+)  Hematology negative hematology ROS (+)   Anesthesia Other Findings   Reproductive/Obstetrics negative OB ROS                             Anesthesia Physical Anesthesia Plan  ASA: 3  Anesthesia Plan: MAC   Post-op Pain Management:    Induction: Intravenous  PONV Risk Score and Plan: 2 and Propofol infusion and Treatment may vary due to age or medical condition  Airway Management Planned: Simple Face Mask  Additional Equipment:   Intra-op Plan:   Post-operative Plan:   Informed Consent: I have reviewed the patients History and Physical, chart, labs and discussed the procedure including the risks, benefits and alternatives for the proposed anesthesia with the patient or authorized representative who has indicated his/her understanding and acceptance.     Dental advisory given  Plan Discussed with: CRNA and Surgeon  Anesthesia Plan Comments:         Anesthesia Quick Evaluation

## 2020-12-30 NOTE — Anesthesia Procedure Notes (Signed)
Procedure Name: MAC Date/Time: 12/30/2020 8:39 AM Performed by: Lollie Sails, CRNA Pre-anesthesia Checklist: Patient identified, Emergency Drugs available, Suction available, Patient being monitored and Timeout performed Oxygen Delivery Method: Simple face mask Placement Confirmation: positive ETCO2

## 2020-12-30 NOTE — Op Note (Signed)
[facility] Patient Name: Cynthia Fitzgerald Procedure Date: 12/30/2020 MRN: [id number] Attending MD: Carol Ada , MD Date of Birth: 08-26-1955 CSN: [id number] Age: 65 Admit Type: Outpatient Procedure:                Colonoscopy Indications:              Hematochezia Providers:                Carol Ada, MD, Dulcy Fanny, Cletis [place],                            Technician, [place], Technician, Edman Circle.                            Zenia Resides CRNA, CRNA Referring MD:              Medicines:                Propofol per Anesthesia Complications:            No immediate complications. Estimated Blood Loss:     Estimated blood loss: none. Procedure:                Pre-Anesthesia Assessment:                           - Prior to the procedure, a History and Physical                            was performed, and patient medications and                            allergies were reviewed. The patient's tolerance of                            previous anesthesia was also reviewed. The risks                            and benefits of the procedure and the sedation                            options and risks were discussed with the patient.                            All questions were answered, and informed consent                            was obtained. Prior Anticoagulants: The patient has                            taken no previous anticoagulant or antiplatelet                            agents. ASA Grade Assessment: III - A patient with                            severe systemic disease. After  reviewing the risks                            and benefits, the patient was deemed in                            satisfactory condition to undergo the procedure.                           - Sedation was administered by an anesthesia                            professional. Deep sedation was attained.                           After obtaining informed consent, the colonoscope                             was passed under direct vision. Throughout the                            procedure, the patient's blood pressure, pulse, and                            oxygen saturations were monitored continuously. The                            CF-HQ190L ([id number]) Olympus colonoscope was                            introduced through the anus and advanced to the the                            cecum, identified by appendiceal orifice and                            ileocecal valve. The colonoscopy was performed                            without difficulty. The patient tolerated the                            procedure well. The quality of the bowel                            preparation was good. The ileocecal valve,                            appendiceal orifice, and rectum were photographed. Scope In: 8:42:09 AM Scope Out: 9:05:23 AM Scope Withdrawal Time: 0 hours 20 minutes 12 seconds  Total Procedure Duration: 0 hours 23 minutes 14 seconds  Findings:      Four sessile polyps were found in the descending colon and transverse       colon. The polyps were 2 to 3 mm in size. These polyps were  removed with       a cold snare. Resection and retrieval were complete.      A few small-mouthed diverticula were found in the sigmoid colon. Impression:               - Four 2 to 3 mm polyps in the descending colon and                            in the transverse colon, removed with a cold snare.                            Resected and retrieved.                           - Diverticulosis in the sigmoid colon. Moderate Sedation:      Not Applicable - Patient had care per Anesthesia. Recommendation:           - Patient has a contact number available for                            emergencies. The signs and symptoms of potential                            delayed complications were discussed with the                            patient. Return to normal activities tomorrow.                             Written discharge instructions were provided to the                            patient.                           - Resume previous diet.                           - Continue present medications.                           - Await pathology results.                           - Repeat colonoscopy in 5 years for surveillance. Procedure Code(s):        --- Professional ---                           [phone], Colonoscopy, flexible; with removal of                            tumor(s), polyp(s), or other lesion(s) by snare                            technique Diagnosis Code(s):        --- Professional ---  K63.5, Polyp of colon                           K92.1, Melena (includes Hematochezia)                           K57.30, Diverticulosis of large intestine without                            perforation or abscess without bleeding CPT copyright 2019 [employer]. All rights reserved. The codes documented in this report are preliminary and upon coder review may  be revised to meet current compliance requirements. Carol Ada, MD Carol Ada, MD 12/30/2020 9:09:59 AM This report has been signed electronically. Number of Addenda: 0

## 2020-12-30 NOTE — H&P (Signed)
Cynthia Fitzgerald HPI: This 65 year old white female presents to the office for further evaluation of rectal bleeding. She has had a change in her bowel habits since starting Ozempic for weight loss, although she also had changes in her bowel habits early this year after having Covid. The stools tend to be harder but she still has 1-2 BM's per day. She has seen bright red blood in the toilet a few times. She denies having rectal pain. She has a little lower abdominal pressure and suspects she may have a hernia. She is taking Famotidine for acid reflux on a PRN schedule. She has a good appetite and her weight has been stable. She denies having any complaints of nausea, vomiting, dysphagia or odynophagia. She denies having a family history of colon cancer, celiac sprue or IBD. Her last colonoscopy was done on 09/07/2015 when a benign serrated polyp was removed from the sigmoid colon and random colonic biopsies were benign with no evidence of colitis.  Past Medical History:  Diagnosis Date   Cataract    Diabetes mellitus type 2, controlled ([place]) 12/01/2018   A1c 6.5   Headache(784.0)    Hyperlipidemia    Morbid obesity ([facility])     Past Surgical History:  Procedure Laterality Date   BREAST SURGERY     Breast reduction   CESAREAN SECTION     EYE SURGERY Right 11/13/11   cataract    Family History  Problem Relation Age of Onset   Hyperlipidemia Mother    Cancer Mother        breast cancer   Hypertension Father    Diabetes Father    Hyperlipidemia Father    Cancer Father        prostate   Cancer Maternal Grandmother        uterine and cervical   Diabetes Paternal Grandmother    Cancer Paternal Grandfather        Esophageal and lung cancer   Cancer Sister        breast cancer   Hyperlipidemia Sister    Diabetes Brother    Hypertension Brother    Hyperlipidemia Brother    Hyperlipidemia Sister     Social History:  reports that she has never smoked. She has never used smokeless tobacco.  She reports current alcohol use of about 2.0 standard drinks per week. She reports that she does not use drugs.  Allergies:  Allergies  Allergen Reactions   Erythromycin Nausea Only    Medications: Scheduled: Continuous:  sodium chloride     lactated ringers      No results found for this or any previous visit (from the past 24 hour(s)).   No results found.  ROS:  As stated above in the HPI otherwise negative.  There were no vitals taken for this visit.    PE: Gen: NAD, Alert and Oriented HEENT:  [place]/AT, EOMI Neck: Supple, no LAD Lungs: CTA Bilaterally CV: RRR without M/G/R ABD: Soft, NTND, +BS Ext: No C/C/E  Assessment/Plan: 1) Hematochezia - colonoscopy.  Wana Mount D 12/30/2020, 8:03 AM

## 2020-12-30 NOTE — Transfer of Care (Signed)
Immediate Anesthesia Transfer of Care Note  Patient: Cynthia Fitzgerald  Procedure(s) Performed: COLONOSCOPY WITH PROPOFOL POLYPECTOMY  Patient Location: PACU and Endoscopy Unit  Anesthesia Type:MAC  Level of Consciousness: awake, alert  and oriented  Airway & Oxygen Therapy: Patient Spontanous Breathing and Patient connected to face mask oxygen  Post-op Assessment: Report given to RN and Post -op Vital signs reviewed and stable  Post vital signs: Reviewed and stable  Last Vitals:  Vitals Value Taken Time  BP 92/52 12/30/20 0912  Temp    Pulse 76 12/30/20 0913  Resp 16 12/30/20 0913  SpO2 100 % 12/30/20 0913  Vitals shown include unvalidated device data.  Last Pain:  Vitals:   12/30/20 0825  TempSrc: Oral         Complications: No notable events documented.

## 2020-12-30 NOTE — Anesthesia Postprocedure Evaluation (Signed)
Anesthesia Post Note  Patient: Cynthia Fitzgerald  Procedure(s) Performed: COLONOSCOPY WITH PROPOFOL POLYPECTOMY     Patient location during evaluation: PACU Anesthesia Type: MAC Level of consciousness: awake and alert Pain management: pain level controlled Vital Signs Assessment: post-procedure vital signs reviewed and stable Respiratory status: spontaneous breathing, nonlabored ventilation, respiratory function stable and patient connected to nasal cannula oxygen Cardiovascular status: stable and blood pressure returned to baseline Postop Assessment: no apparent nausea or vomiting Anesthetic complications: no   No notable events documented.  Last Vitals:  Vitals:   12/30/20 0920 12/30/20 0930  BP: 110/71 101/67  Pulse: 74 69  Resp: (!) 25 14  Temp:    SpO2: 93% 95%    Last Pain:  Vitals:   12/30/20 0916  TempSrc: Oral                 Husam Hohn S

## 2021-01-02 ENCOUNTER — Encounter (HOSPITAL_COMMUNITY): Payer: Self-pay | Admitting: Gastroenterology

## 2021-01-02 LAB — SURGICAL PATHOLOGY

## 2021-01-17 DIAGNOSIS — H3581 Retinal edema: Secondary | ICD-10-CM | POA: Diagnosis not present

## 2021-01-17 DIAGNOSIS — H35373 Puckering of macula, bilateral: Secondary | ICD-10-CM | POA: Diagnosis not present

## 2021-02-07 DIAGNOSIS — R1032 Left lower quadrant pain: Secondary | ICD-10-CM | POA: Diagnosis not present

## 2021-02-07 DIAGNOSIS — M549 Dorsalgia, unspecified: Secondary | ICD-10-CM | POA: Diagnosis not present

## 2021-02-22 DIAGNOSIS — Z1231 Encounter for screening mammogram for malignant neoplasm of breast: Secondary | ICD-10-CM | POA: Diagnosis not present

## 2021-03-30 DIAGNOSIS — K219 Gastro-esophageal reflux disease without esophagitis: Secondary | ICD-10-CM | POA: Diagnosis not present

## 2021-03-30 DIAGNOSIS — R634 Abnormal weight loss: Secondary | ICD-10-CM | POA: Diagnosis not present

## 2021-03-30 DIAGNOSIS — E119 Type 2 diabetes mellitus without complications: Secondary | ICD-10-CM | POA: Diagnosis not present

## 2021-03-30 DIAGNOSIS — E785 Hyperlipidemia, unspecified: Secondary | ICD-10-CM | POA: Diagnosis not present

## 2021-04-03 DIAGNOSIS — E119 Type 2 diabetes mellitus without complications: Secondary | ICD-10-CM | POA: Diagnosis not present

## 2021-04-03 DIAGNOSIS — R634 Abnormal weight loss: Secondary | ICD-10-CM | POA: Diagnosis not present

## 2021-04-03 DIAGNOSIS — E785 Hyperlipidemia, unspecified: Secondary | ICD-10-CM | POA: Diagnosis not present

## 2021-04-06 DIAGNOSIS — K219 Gastro-esophageal reflux disease without esophagitis: Secondary | ICD-10-CM | POA: Diagnosis not present

## 2021-04-06 DIAGNOSIS — E119 Type 2 diabetes mellitus without complications: Secondary | ICD-10-CM | POA: Diagnosis not present

## 2021-04-06 DIAGNOSIS — E785 Hyperlipidemia, unspecified: Secondary | ICD-10-CM | POA: Diagnosis not present

## 2021-04-06 DIAGNOSIS — M4316 Spondylolisthesis, lumbar region: Secondary | ICD-10-CM | POA: Diagnosis not present

## 2021-04-06 DIAGNOSIS — Z0000 Encounter for general adult medical examination without abnormal findings: Secondary | ICD-10-CM | POA: Diagnosis not present

## 2021-04-06 DIAGNOSIS — M549 Dorsalgia, unspecified: Secondary | ICD-10-CM | POA: Diagnosis not present

## 2021-09-11 DIAGNOSIS — Z961 Presence of intraocular lens: Secondary | ICD-10-CM | POA: Diagnosis not present

## 2021-09-11 DIAGNOSIS — H524 Presbyopia: Secondary | ICD-10-CM | POA: Diagnosis not present

## 2021-09-28 DIAGNOSIS — E785 Hyperlipidemia, unspecified: Secondary | ICD-10-CM | POA: Diagnosis not present

## 2021-09-28 DIAGNOSIS — K219 Gastro-esophageal reflux disease without esophagitis: Secondary | ICD-10-CM | POA: Diagnosis not present

## 2021-09-28 DIAGNOSIS — Z780 Asymptomatic menopausal state: Secondary | ICD-10-CM | POA: Diagnosis not present

## 2021-09-28 DIAGNOSIS — E119 Type 2 diabetes mellitus without complications: Secondary | ICD-10-CM | POA: Diagnosis not present

## 2021-11-07 DIAGNOSIS — M19071 Primary osteoarthritis, right ankle and foot: Secondary | ICD-10-CM | POA: Diagnosis not present

## 2021-11-07 DIAGNOSIS — M2042 Other hammer toe(s) (acquired), left foot: Secondary | ICD-10-CM | POA: Diagnosis not present

## 2021-11-07 DIAGNOSIS — M2011 Hallux valgus (acquired), right foot: Secondary | ICD-10-CM | POA: Diagnosis not present

## 2021-11-07 DIAGNOSIS — M2041 Other hammer toe(s) (acquired), right foot: Secondary | ICD-10-CM | POA: Diagnosis not present

## 2021-11-07 DIAGNOSIS — M792 Neuralgia and neuritis, unspecified: Secondary | ICD-10-CM | POA: Diagnosis not present

## 2021-11-07 DIAGNOSIS — M7752 Other enthesopathy of left foot: Secondary | ICD-10-CM | POA: Diagnosis not present

## 2021-11-07 DIAGNOSIS — M19072 Primary osteoarthritis, left ankle and foot: Secondary | ICD-10-CM | POA: Diagnosis not present

## 2021-11-07 DIAGNOSIS — M2012 Hallux valgus (acquired), left foot: Secondary | ICD-10-CM | POA: Diagnosis not present

## 2021-12-25 DIAGNOSIS — M2042 Other hammer toe(s) (acquired), left foot: Secondary | ICD-10-CM | POA: Diagnosis not present

## 2021-12-25 DIAGNOSIS — M792 Neuralgia and neuritis, unspecified: Secondary | ICD-10-CM | POA: Diagnosis not present

## 2021-12-25 DIAGNOSIS — M2011 Hallux valgus (acquired), right foot: Secondary | ICD-10-CM | POA: Diagnosis not present

## 2021-12-25 DIAGNOSIS — M2012 Hallux valgus (acquired), left foot: Secondary | ICD-10-CM | POA: Diagnosis not present

## 2021-12-25 DIAGNOSIS — M722 Plantar fascial fibromatosis: Secondary | ICD-10-CM | POA: Diagnosis not present

## 2021-12-25 DIAGNOSIS — M7752 Other enthesopathy of left foot: Secondary | ICD-10-CM | POA: Diagnosis not present

## 2021-12-25 DIAGNOSIS — M2041 Other hammer toe(s) (acquired), right foot: Secondary | ICD-10-CM | POA: Diagnosis not present

## 2021-12-25 DIAGNOSIS — M19071 Primary osteoarthritis, right ankle and foot: Secondary | ICD-10-CM | POA: Diagnosis not present

## 2021-12-25 DIAGNOSIS — M19072 Primary osteoarthritis, left ankle and foot: Secondary | ICD-10-CM | POA: Diagnosis not present

## 2022-01-17 DIAGNOSIS — M19072 Primary osteoarthritis, left ankle and foot: Secondary | ICD-10-CM | POA: Diagnosis not present

## 2022-01-17 DIAGNOSIS — M19071 Primary osteoarthritis, right ankle and foot: Secondary | ICD-10-CM | POA: Diagnosis not present

## 2022-01-17 DIAGNOSIS — M792 Neuralgia and neuritis, unspecified: Secondary | ICD-10-CM | POA: Diagnosis not present

## 2022-01-17 DIAGNOSIS — M7752 Other enthesopathy of left foot: Secondary | ICD-10-CM | POA: Diagnosis not present

## 2022-01-17 DIAGNOSIS — M2041 Other hammer toe(s) (acquired), right foot: Secondary | ICD-10-CM | POA: Diagnosis not present

## 2022-01-17 DIAGNOSIS — M2011 Hallux valgus (acquired), right foot: Secondary | ICD-10-CM | POA: Diagnosis not present

## 2022-01-17 DIAGNOSIS — M722 Plantar fascial fibromatosis: Secondary | ICD-10-CM | POA: Diagnosis not present

## 2022-01-17 DIAGNOSIS — M2042 Other hammer toe(s) (acquired), left foot: Secondary | ICD-10-CM | POA: Diagnosis not present

## 2022-01-17 DIAGNOSIS — M2012 Hallux valgus (acquired), left foot: Secondary | ICD-10-CM | POA: Diagnosis not present

## 2022-01-18 ENCOUNTER — Encounter: Payer: Self-pay | Admitting: *Deleted

## 2022-01-18 ENCOUNTER — Telehealth: Payer: Self-pay | Admitting: *Deleted

## 2022-01-18 NOTE — Patient Outreach (Signed)
  Care Coordination   Initial Visit Note   27/0/7867 Name: Cynthia Fitzgerald MRN: [id number] DOB: 09/27/1974  Cynthia Fitzgerald is a 66 y.o. year old female who sees Janie Morning, [place] for primary care. I spoke with  Erroll Luna by phone today.  What matters to the patients health and wellness today?  No needs    Goals Addressed               This Visit's Progress     COMPLETED: No needs (pt-stated)        Care Coordination Interventions: Reviewed medications with patient and discussed adherence with all medications Reviewed scheduled/upcoming provider appointments including pending appointments Screening for signs and symptoms of depression related to chronic disease state  Assessed social determinant of health barriers         SDOH assessments and interventions completed:  Yes  SDOH Interventions Today    Flowsheet Row Most Recent Value  SDOH Interventions   Food Insecurity Interventions Intervention Not Indicated  Housing Interventions Intervention Not Indicated  Transportation Interventions Intervention Not Indicated  Utilities Interventions Intervention Not Indicated        Care Coordination Interventions Activated:  Yes  Care Coordination Interventions:  Yes, provided   Follow up plan: No further intervention required.   Encounter Outcome:  Pt. Visit Completed   Raina Mina, RN Care Management Coordinator [place] Office [phone]

## 2022-01-18 NOTE — Patient Instructions (Signed)
Visit Information  Thank you for taking time to visit with me today. Please don't hesitate to contact me if I can be of assistance to you.   Following are the goals we discussed today:   Goals Addressed               This Visit's Progress     COMPLETED: No needs (pt-stated)        Care Coordination Interventions: Reviewed medications with patient and discussed adherence with all medications Reviewed scheduled/upcoming provider appointments including pending appointments Screening for signs and symptoms of depression related to chronic disease state  Assessed social determinant of health barriers         Please call the care guide team at [phone] if you need to cancel or reschedule your appointment.   If you are experiencing a Mental Health or [place] or need someone to talk to, please call the Suicide and Crisis Lifeline: 988  Patient verbalizes understanding of instructions and care plan provided today and agrees to view in [place]. Active MyChart status and patient understanding of how to access instructions and care plan via MyChart confirmed with patient.     No further follow up required: no needs   Raina Mina, RN Care Management Coordinator [place] Office [phone]

## 2022-09-06 DIAGNOSIS — H5202 Hypermetropia, left eye: Secondary | ICD-10-CM | POA: Diagnosis not present

## 2022-09-06 DIAGNOSIS — Z961 Presence of intraocular lens: Secondary | ICD-10-CM | POA: Diagnosis not present

## 2022-09-06 DIAGNOSIS — H35371 Puckering of macula, right eye: Secondary | ICD-10-CM | POA: Diagnosis not present

## 2023-01-24 DIAGNOSIS — M2012 Hallux valgus (acquired), left foot: Secondary | ICD-10-CM | POA: Diagnosis not present

## 2023-01-24 DIAGNOSIS — M19071 Primary osteoarthritis, right ankle and foot: Secondary | ICD-10-CM | POA: Diagnosis not present

## 2023-01-24 DIAGNOSIS — M2042 Other hammer toe(s) (acquired), left foot: Secondary | ICD-10-CM | POA: Diagnosis not present

## 2023-01-24 DIAGNOSIS — M722 Plantar fascial fibromatosis: Secondary | ICD-10-CM | POA: Diagnosis not present

## 2023-01-24 DIAGNOSIS — M2041 Other hammer toe(s) (acquired), right foot: Secondary | ICD-10-CM | POA: Diagnosis not present

## 2023-01-24 DIAGNOSIS — M7662 Achilles tendinitis, left leg: Secondary | ICD-10-CM | POA: Diagnosis not present

## 2023-01-24 DIAGNOSIS — M2011 Hallux valgus (acquired), right foot: Secondary | ICD-10-CM | POA: Diagnosis not present

## 2023-01-24 DIAGNOSIS — M24575 Contracture, left foot: Secondary | ICD-10-CM | POA: Diagnosis not present

## 2023-01-24 DIAGNOSIS — E139 Other specified diabetes mellitus without complications: Secondary | ICD-10-CM | POA: Diagnosis not present

## 2023-01-24 DIAGNOSIS — M792 Neuralgia and neuritis, unspecified: Secondary | ICD-10-CM | POA: Diagnosis not present

## 2023-01-24 DIAGNOSIS — M19072 Primary osteoarthritis, left ankle and foot: Secondary | ICD-10-CM | POA: Diagnosis not present

## 2023-02-26 DIAGNOSIS — M19072 Primary osteoarthritis, left ankle and foot: Secondary | ICD-10-CM | POA: Diagnosis not present

## 2023-02-26 DIAGNOSIS — M24575 Contracture, left foot: Secondary | ICD-10-CM | POA: Diagnosis not present

## 2023-02-26 DIAGNOSIS — M19071 Primary osteoarthritis, right ankle and foot: Secondary | ICD-10-CM | POA: Diagnosis not present

## 2023-02-26 DIAGNOSIS — M722 Plantar fascial fibromatosis: Secondary | ICD-10-CM | POA: Diagnosis not present

## 2023-02-26 DIAGNOSIS — M2012 Hallux valgus (acquired), left foot: Secondary | ICD-10-CM | POA: Diagnosis not present

## 2023-02-26 DIAGNOSIS — M2041 Other hammer toe(s) (acquired), right foot: Secondary | ICD-10-CM | POA: Diagnosis not present

## 2023-02-26 DIAGNOSIS — M2042 Other hammer toe(s) (acquired), left foot: Secondary | ICD-10-CM | POA: Diagnosis not present

## 2023-02-26 DIAGNOSIS — E139 Other specified diabetes mellitus without complications: Secondary | ICD-10-CM | POA: Diagnosis not present

## 2023-02-26 DIAGNOSIS — M792 Neuralgia and neuritis, unspecified: Secondary | ICD-10-CM | POA: Diagnosis not present

## 2023-02-26 DIAGNOSIS — M7662 Achilles tendinitis, left leg: Secondary | ICD-10-CM | POA: Diagnosis not present

## 2023-02-26 DIAGNOSIS — M2011 Hallux valgus (acquired), right foot: Secondary | ICD-10-CM | POA: Diagnosis not present

## 2023-03-25 DIAGNOSIS — M792 Neuralgia and neuritis, unspecified: Secondary | ICD-10-CM | POA: Diagnosis not present

## 2023-03-25 DIAGNOSIS — M2012 Hallux valgus (acquired), left foot: Secondary | ICD-10-CM | POA: Diagnosis not present

## 2023-03-25 DIAGNOSIS — M19072 Primary osteoarthritis, left ankle and foot: Secondary | ICD-10-CM | POA: Diagnosis not present

## 2023-03-25 DIAGNOSIS — M7662 Achilles tendinitis, left leg: Secondary | ICD-10-CM | POA: Diagnosis not present

## 2023-03-25 DIAGNOSIS — E139 Other specified diabetes mellitus without complications: Secondary | ICD-10-CM | POA: Diagnosis not present

## 2023-03-25 DIAGNOSIS — M24575 Contracture, left foot: Secondary | ICD-10-CM | POA: Diagnosis not present

## 2023-03-25 DIAGNOSIS — M2041 Other hammer toe(s) (acquired), right foot: Secondary | ICD-10-CM | POA: Diagnosis not present

## 2023-03-25 DIAGNOSIS — M2011 Hallux valgus (acquired), right foot: Secondary | ICD-10-CM | POA: Diagnosis not present

## 2023-03-25 DIAGNOSIS — M722 Plantar fascial fibromatosis: Secondary | ICD-10-CM | POA: Diagnosis not present

## 2023-03-25 DIAGNOSIS — M19071 Primary osteoarthritis, right ankle and foot: Secondary | ICD-10-CM | POA: Diagnosis not present

## 2023-03-25 DIAGNOSIS — M2042 Other hammer toe(s) (acquired), left foot: Secondary | ICD-10-CM | POA: Diagnosis not present

## 2023-03-26 DIAGNOSIS — M25562 Pain in left knee: Secondary | ICD-10-CM | POA: Diagnosis not present

## 2023-04-11 DIAGNOSIS — Z1231 Encounter for screening mammogram for malignant neoplasm of breast: Secondary | ICD-10-CM | POA: Diagnosis not present

## 2023-04-22 DIAGNOSIS — M19071 Primary osteoarthritis, right ankle and foot: Secondary | ICD-10-CM | POA: Diagnosis not present

## 2023-04-22 DIAGNOSIS — M2012 Hallux valgus (acquired), left foot: Secondary | ICD-10-CM | POA: Diagnosis not present

## 2023-04-22 DIAGNOSIS — M722 Plantar fascial fibromatosis: Secondary | ICD-10-CM | POA: Diagnosis not present

## 2023-04-22 DIAGNOSIS — M24575 Contracture, left foot: Secondary | ICD-10-CM | POA: Diagnosis not present

## 2023-04-22 DIAGNOSIS — M2011 Hallux valgus (acquired), right foot: Secondary | ICD-10-CM | POA: Diagnosis not present

## 2023-04-22 DIAGNOSIS — E139 Other specified diabetes mellitus without complications: Secondary | ICD-10-CM | POA: Diagnosis not present

## 2023-04-22 DIAGNOSIS — M2041 Other hammer toe(s) (acquired), right foot: Secondary | ICD-10-CM | POA: Diagnosis not present

## 2023-04-22 DIAGNOSIS — M792 Neuralgia and neuritis, unspecified: Secondary | ICD-10-CM | POA: Diagnosis not present

## 2023-04-22 DIAGNOSIS — M2042 Other hammer toe(s) (acquired), left foot: Secondary | ICD-10-CM | POA: Diagnosis not present

## 2023-04-22 DIAGNOSIS — M7662 Achilles tendinitis, left leg: Secondary | ICD-10-CM | POA: Diagnosis not present

## 2023-04-22 DIAGNOSIS — M19072 Primary osteoarthritis, left ankle and foot: Secondary | ICD-10-CM | POA: Diagnosis not present

## 2023-04-30 DIAGNOSIS — M792 Neuralgia and neuritis, unspecified: Secondary | ICD-10-CM | POA: Diagnosis not present

## 2023-04-30 DIAGNOSIS — M24575 Contracture, left foot: Secondary | ICD-10-CM | POA: Diagnosis not present

## 2023-04-30 DIAGNOSIS — M19072 Primary osteoarthritis, left ankle and foot: Secondary | ICD-10-CM | POA: Diagnosis not present

## 2023-04-30 DIAGNOSIS — M722 Plantar fascial fibromatosis: Secondary | ICD-10-CM | POA: Diagnosis not present

## 2023-04-30 DIAGNOSIS — E139 Other specified diabetes mellitus without complications: Secondary | ICD-10-CM | POA: Diagnosis not present

## 2023-04-30 DIAGNOSIS — M2042 Other hammer toe(s) (acquired), left foot: Secondary | ICD-10-CM | POA: Diagnosis not present

## 2023-04-30 DIAGNOSIS — M19071 Primary osteoarthritis, right ankle and foot: Secondary | ICD-10-CM | POA: Diagnosis not present

## 2023-04-30 DIAGNOSIS — M2011 Hallux valgus (acquired), right foot: Secondary | ICD-10-CM | POA: Diagnosis not present

## 2023-04-30 DIAGNOSIS — M7662 Achilles tendinitis, left leg: Secondary | ICD-10-CM | POA: Diagnosis not present

## 2023-04-30 DIAGNOSIS — M2041 Other hammer toe(s) (acquired), right foot: Secondary | ICD-10-CM | POA: Diagnosis not present

## 2023-04-30 DIAGNOSIS — M2012 Hallux valgus (acquired), left foot: Secondary | ICD-10-CM | POA: Diagnosis not present

## 2023-05-06 DIAGNOSIS — G4733 Obstructive sleep apnea (adult) (pediatric): Secondary | ICD-10-CM | POA: Diagnosis not present

## 2023-05-07 DIAGNOSIS — G4733 Obstructive sleep apnea (adult) (pediatric): Secondary | ICD-10-CM | POA: Diagnosis not present

## 2023-05-14 DIAGNOSIS — G4733 Obstructive sleep apnea (adult) (pediatric): Secondary | ICD-10-CM | POA: Diagnosis not present

## 2023-05-20 DIAGNOSIS — E139 Other specified diabetes mellitus without complications: Secondary | ICD-10-CM | POA: Diagnosis not present

## 2023-05-20 DIAGNOSIS — M24575 Contracture, left foot: Secondary | ICD-10-CM | POA: Diagnosis not present

## 2023-05-20 DIAGNOSIS — M19071 Primary osteoarthritis, right ankle and foot: Secondary | ICD-10-CM | POA: Diagnosis not present

## 2023-05-20 DIAGNOSIS — M2012 Hallux valgus (acquired), left foot: Secondary | ICD-10-CM | POA: Diagnosis not present

## 2023-05-20 DIAGNOSIS — M7662 Achilles tendinitis, left leg: Secondary | ICD-10-CM | POA: Diagnosis not present

## 2023-05-20 DIAGNOSIS — M2042 Other hammer toe(s) (acquired), left foot: Secondary | ICD-10-CM | POA: Diagnosis not present

## 2023-05-20 DIAGNOSIS — M792 Neuralgia and neuritis, unspecified: Secondary | ICD-10-CM | POA: Diagnosis not present

## 2023-05-20 DIAGNOSIS — M2041 Other hammer toe(s) (acquired), right foot: Secondary | ICD-10-CM | POA: Diagnosis not present

## 2023-05-20 DIAGNOSIS — M2011 Hallux valgus (acquired), right foot: Secondary | ICD-10-CM | POA: Diagnosis not present

## 2023-05-20 DIAGNOSIS — M722 Plantar fascial fibromatosis: Secondary | ICD-10-CM | POA: Diagnosis not present

## 2023-05-20 DIAGNOSIS — M19072 Primary osteoarthritis, left ankle and foot: Secondary | ICD-10-CM | POA: Diagnosis not present

## 2023-05-28 DIAGNOSIS — G4733 Obstructive sleep apnea (adult) (pediatric): Secondary | ICD-10-CM | POA: Diagnosis not present

## 2023-06-17 DIAGNOSIS — M722 Plantar fascial fibromatosis: Secondary | ICD-10-CM | POA: Diagnosis not present

## 2023-06-17 DIAGNOSIS — M19072 Primary osteoarthritis, left ankle and foot: Secondary | ICD-10-CM | POA: Diagnosis not present

## 2023-06-17 DIAGNOSIS — E139 Other specified diabetes mellitus without complications: Secondary | ICD-10-CM | POA: Diagnosis not present

## 2023-06-17 DIAGNOSIS — M792 Neuralgia and neuritis, unspecified: Secondary | ICD-10-CM | POA: Diagnosis not present

## 2023-06-17 DIAGNOSIS — M2041 Other hammer toe(s) (acquired), right foot: Secondary | ICD-10-CM | POA: Diagnosis not present

## 2023-06-17 DIAGNOSIS — M2011 Hallux valgus (acquired), right foot: Secondary | ICD-10-CM | POA: Diagnosis not present

## 2023-06-17 DIAGNOSIS — M2042 Other hammer toe(s) (acquired), left foot: Secondary | ICD-10-CM | POA: Diagnosis not present

## 2023-06-17 DIAGNOSIS — M19071 Primary osteoarthritis, right ankle and foot: Secondary | ICD-10-CM | POA: Diagnosis not present

## 2023-06-17 DIAGNOSIS — M7662 Achilles tendinitis, left leg: Secondary | ICD-10-CM | POA: Diagnosis not present

## 2023-06-17 DIAGNOSIS — M2012 Hallux valgus (acquired), left foot: Secondary | ICD-10-CM | POA: Diagnosis not present

## 2023-06-17 DIAGNOSIS — M24575 Contracture, left foot: Secondary | ICD-10-CM | POA: Diagnosis not present

## 2023-06-19 DIAGNOSIS — G4733 Obstructive sleep apnea (adult) (pediatric): Secondary | ICD-10-CM | POA: Diagnosis not present

## 2023-09-09 DIAGNOSIS — H52203 Unspecified astigmatism, bilateral: Secondary | ICD-10-CM | POA: Diagnosis not present

## 2023-09-09 DIAGNOSIS — Z961 Presence of intraocular lens: Secondary | ICD-10-CM | POA: Diagnosis not present
